# Patient Record
Sex: Male | Born: 1950 | Race: Black or African American | Hispanic: No | Marital: Married | Smoking: Former smoker
Health system: Southern US, Community
[De-identification: ages and names within clinical notes are randomized; demographics above are authoritative.]

## PROBLEM LIST (undated history)

## (undated) DIAGNOSIS — A048 Other specified bacterial intestinal infections: Secondary | ICD-10-CM

---

## 1997-05-23 ENCOUNTER — Emergency Department (HOSPITAL_COMMUNITY): Admission: EM | Admit: 1997-05-23 | Discharge: 1997-05-23 | Payer: Self-pay | Admitting: Emergency Medicine

## 1997-06-19 ENCOUNTER — Emergency Department (HOSPITAL_COMMUNITY): Admission: EM | Admit: 1997-06-19 | Discharge: 1997-06-19 | Payer: Self-pay | Admitting: Emergency Medicine

## 1997-10-09 ENCOUNTER — Emergency Department (HOSPITAL_COMMUNITY): Admission: EM | Admit: 1997-10-09 | Discharge: 1997-10-09 | Payer: Self-pay | Admitting: Emergency Medicine

## 1998-07-02 ENCOUNTER — Emergency Department (HOSPITAL_COMMUNITY): Admission: EM | Admit: 1998-07-02 | Discharge: 1998-07-02 | Payer: Self-pay | Admitting: Emergency Medicine

## 2000-03-11 ENCOUNTER — Emergency Department (HOSPITAL_COMMUNITY): Admission: EM | Admit: 2000-03-11 | Discharge: 2000-03-11 | Payer: Self-pay | Admitting: Emergency Medicine

## 2011-11-11 ENCOUNTER — Encounter (HOSPITAL_COMMUNITY): Payer: Self-pay | Admitting: Emergency Medicine

## 2011-11-11 ENCOUNTER — Emergency Department (HOSPITAL_COMMUNITY)
Admission: EM | Admit: 2011-11-11 | Discharge: 2011-11-11 | Disposition: A | Discharge status: Home / Self Care | Payer: PRIVATE HEALTH INSURANCE | Attending: Emergency Medicine | Admitting: Emergency Medicine

## 2011-11-11 DIAGNOSIS — M79609 Pain in unspecified limb: Secondary | ICD-10-CM | POA: Insufficient documentation

## 2011-11-11 DIAGNOSIS — R21 Rash and other nonspecific skin eruption: Secondary | ICD-10-CM | POA: Insufficient documentation

## 2011-11-11 MED ORDER — PREDNISONE 10 MG PO TABS: 20.0000 mg | ORAL_TABLET | Freq: Every day | ORAL | Status: DC

## 2011-11-11 MED ORDER — DOXYCYCLINE HYCLATE 100 MG PO CAPS: 100.0000 mg | ORAL_CAPSULE | Freq: Two times a day (BID) | ORAL | Status: DC

## 2011-11-11 NOTE — ED Notes (Signed)
Pt presented to ED with skin boils and rashes in his legs.He says it has been there for a month and has been tender sib=nce yesterday.

## 2011-11-11 NOTE — ED Notes (Signed)
Pt discharged.Vital signs stable and GCS 15 

## 2011-11-11 NOTE — ED Notes (Signed)
Pt reports having L leg pain for about 6 months, pt noted to have swelling to leg and many sores/abscesses to legs

## 2011-11-11 NOTE — ED Provider Notes (Signed)
History     CSN: [id number]  Arrival date & time 11/11/11  1650   First MD Initiated Contact with Patient 11/11/11 2150      Chief Complaint  Patient presents with  . Leg Pain    (Consider location/radiation/quality/duration/timing/severity/associated sxs/prior treatment) HPI  61 year old male with no significant past medical history presents complaining of rash and sores on his legs. Patient reports for the past 9 months he has had gradual onset of source which developed initially on his left lower leg and has been spreading throughout his extremities. Sores are described as a achy burning sensation with occasional pruritus. Onset is gradual, persistent, moderate in severity.  Denies fever, chills, abd pain, sores in mouth, hands or feet.  Denies similar rash in other family members.  Denies environmental changes.  Has not tried anything to alleviate his sxs.  Has never been evaluated for this before.  No hx of immunocompromise or hx of diabetes.    History reviewed. No pertinent past medical history.  History reviewed. No pertinent past surgical history.  No family history on file.  History  Substance Use Topics  . Smoking status: Never Smoker   . Smokeless tobacco: Not on file  . Alcohol Use: No      Review of Systems  Constitutional: Negative for fever.  HENT: Negative for sore throat, mouth sores and neck stiffness.   Respiratory: Negative for chest tightness and shortness of breath.   Cardiovascular: Negative for chest pain.  Gastrointestinal: Negative for abdominal pain.  Musculoskeletal: Negative for myalgias, joint swelling and arthralgias.  Skin: Positive for rash and wound.  Neurological: Negative for numbness.    Allergies  Review of patient's allergies indicates no known allergies.  Home Medications  No current outpatient prescriptions on file.  BP 140/97  Pulse 96  Temp 98.9 F (37.2 C) (Oral)  Resp 18  SpO2 97%  Physical Exam  Nursing note  and vitals reviewed. Constitutional: He is oriented to person, place, and time. He appears well-developed and well-nourished. No distress.  HENT:  Head: Atraumatic.  Mouth/Throat: Oropharynx is clear and moist. No oropharyngeal exudate.  Eyes: Conjunctivae normal and EOM are normal. Pupils are equal, round, and reactive to light.  Neck: Neck supple.  Cardiovascular: Normal rate and regular rhythm.   Pulmonary/Chest: Effort normal and breath sounds normal. No respiratory distress. He exhibits no tenderness.  Abdominal: Soft. He exhibits no distension. There is no tenderness.  Genitourinary: Penis normal.  Musculoskeletal: Normal range of motion. He exhibits no edema.  Neurological: He is alert and oriented to person, place, and time.  Skin: Skin is warm. Rash (pt has significant small maculopustular lesions throughout lower extremities, upper forarms, and on lower abdomen. Rash is scaly, some with surrounding erythema.  No obvious abscess.  Non petechiall) noted.       No oral mucosal involvement.  Rash spares palms of hands and soles of feet.      ED Course  Procedures (including critical care time)  Labs Reviewed - No data to display No results found.   No diagnosis found.  1. rash  MDM  Pt has impressive pustulovesicular rash throughout his lower extremities, forearms, and low abdomen.  He has no systemic involvement.  No mucosal involvement, is afebrile.  There are excoriation marks and scaly skin changes throughout.  I plan do prescribe doxycycline as treatment, prednisone to decrease itch, and referral resources for further care.  Pt has no abscess amenable to I&D.    BP  140/97  Pulse 96  Temp 98.9 F (37.2 C) (Oral)  Resp 18  SpO2 97%       Fayrene Helper, PA-C 11/11/11 2220

## 2011-11-12 NOTE — ED Provider Notes (Signed)
Medical screening examination/treatment/procedure(s) were performed by non-physician practitioner and as supervising physician I was immediately available for consultation/collaboration.   Lyanne Co, MD 11/12/11 [phone]

## 2012-05-03 ENCOUNTER — Emergency Department (HOSPITAL_COMMUNITY)
Admission: EM | Admit: 2012-05-03 | Discharge: 2012-05-03 | Disposition: A | Discharge status: Home / Self Care | Payer: PRIVATE HEALTH INSURANCE | Attending: Emergency Medicine | Admitting: Emergency Medicine

## 2012-05-03 ENCOUNTER — Emergency Department (HOSPITAL_COMMUNITY): Payer: PRIVATE HEALTH INSURANCE

## 2012-05-03 ENCOUNTER — Encounter (HOSPITAL_COMMUNITY): Payer: Self-pay | Admitting: Nurse Practitioner

## 2012-05-03 DIAGNOSIS — M25579 Pain in unspecified ankle and joints of unspecified foot: Secondary | ICD-10-CM | POA: Insufficient documentation

## 2012-05-03 DIAGNOSIS — B354 Tinea corporis: Secondary | ICD-10-CM | POA: Insufficient documentation

## 2012-05-03 DIAGNOSIS — R21 Rash and other nonspecific skin eruption: Secondary | ICD-10-CM | POA: Insufficient documentation

## 2012-05-03 DIAGNOSIS — M79671 Pain in right foot: Secondary | ICD-10-CM

## 2012-05-03 MED ORDER — TERBINAFINE HCL 125 MG PO PACK: 250.0000 mg | PACK | Freq: Every day | ORAL | Status: DC

## 2012-05-03 MED ORDER — IBUPROFEN 800 MG PO TABS: 800.0000 mg | ORAL_TABLET | Freq: Three times a day (TID) | ORAL | Status: DC

## 2012-05-03 NOTE — ED Provider Notes (Signed)
History    This chart was scribed for non-physician practitioner Elpidio Anis, PA-C working with Celene Kras, MD by Gerlean Ren, ED Scribe. This patient was seen in room TR09C/TR09C and the patient's care was started at 6:35 PM.   CSN: [id number]  Arrival date & time 05/03/12  1640   First MD Initiated Contact with Patient 05/03/12 1834      Chief Complaint  Patient presents with  . Leg Swelling  . Rash    The history is provided by the patient. No language interpreter was used.  Jesse Larsen is a 62 y.o. male who presents to the Emergency Department complaining of a rash under his left axillary region first noticed one month ago that began releasing odor recently.  Pt received ketoconazole cream and used it with no relief.   Pt also complains of pain and swelling to left ankle and left foot that began 3 weeks ago with no associated injury or trauma.  Pt reports he is on his feet all day at work, which worsens pain.   History reviewed. No pertinent past medical history.  History reviewed. No pertinent past surgical history.  History reviewed. No pertinent family history.  History  Substance Use Topics  . Smoking status: Never Smoker   . Smokeless tobacco: Not on file  . Alcohol Use: No      Review of Systems  Musculoskeletal:       Positive left foot pain  Skin: Positive for rash.  All other systems reviewed and are negative.    Allergies  Review of patient's allergies indicates no known allergies.  Home Medications  No current outpatient prescriptions on file.  BP 130/83  Pulse 82  Temp(Src) 98.2 F (36.8 C)  Resp 18  SpO2 99%  Physical Exam  Nursing note and vitals reviewed. Constitutional: He is oriented to person, place, and time. He appears well-developed and well-nourished. No distress.  HENT:  Head: Normocephalic and atraumatic.  Eyes: EOM are normal.  Neck: Neck supple. No tracheal deviation present.  Cardiovascular: Normal rate.    Pulmonary/Chest: Effort normal. No respiratory distress.  Musculoskeletal: Normal range of motion.  Neurological: He is alert and oriented to person, place, and time.  Normal strength in left foot.  Skin: Skin is warm and dry.  Thickened plaque left axilla multiple scaling circular scaling lesions to torso consistent with tinea. Left foot tender to the medial aspect of proximal arch, no bony deformity, no redness, no lesion.  Psychiatric: He has a normal mood and affect. His behavior is normal.    ED Course  Procedures (including critical care time) DIAGNOSTIC STUDIES: Oxygen Saturation is 99% on room air, normal by my interpretation.    COORDINATION OF CARE: 6:39 PM-Discussed anti-fungal PO medication and left foot XR.  Pt offered pain medicine but declines.  Pt verbalizes understanding.     No diagnosis found.  1. Tinea corporis 2. Right foot pain  MDM  Rash c/w tinea - uncomplicated. No bony abnormalities on non-traumatic foot pain - suspect mild musculoskeletal strain.      I personally performed the services described in this documentation, which was scribed in my presence. The recorded information has been reviewed and is accurate.     Arnoldo Hooker, PA-C 05/10/12 2309

## 2012-05-03 NOTE — ED Notes (Signed)
C/o painful red spots under armpits for past month. Saw doctor and was prescribed ketoconazole cream with no relief. Also c/o recent L ankle swelling after he has been standing all day. States pain on palpation to L foot

## 2012-05-11 NOTE — ED Provider Notes (Signed)
Medical screening examination/treatment/procedure(s) were performed by non-physician practitioner and as supervising physician I was immediately available for consultation/collaboration.    Nancyann Cotterman R Knute Mazzuca, MD 05/11/12 0752 

## 2014-04-03 ENCOUNTER — Encounter (HOSPITAL_COMMUNITY): Payer: Self-pay

## 2014-04-03 ENCOUNTER — Emergency Department (HOSPITAL_COMMUNITY)
Admission: EM | Admit: 2014-04-03 | Discharge: 2014-04-03 | Disposition: A | Discharge status: Home / Self Care | Payer: PRIVATE HEALTH INSURANCE | Attending: Emergency Medicine | Admitting: Emergency Medicine

## 2014-04-03 DIAGNOSIS — K625 Hemorrhage of anus and rectum: Secondary | ICD-10-CM

## 2014-04-03 DIAGNOSIS — Z8619 Personal history of other infectious and parasitic diseases: Secondary | ICD-10-CM | POA: Diagnosis not present

## 2014-04-03 DIAGNOSIS — Z79899 Other long term (current) drug therapy: Secondary | ICD-10-CM | POA: Insufficient documentation

## 2014-04-03 HISTORY — DX: Other specified bacterial intestinal infections: A04.8

## 2014-04-03 LAB — CBC
HCT: 37.6 % — ABNORMAL LOW (ref 39.0–52.0)
Hemoglobin: 12.5 g/dL — ABNORMAL LOW (ref 13.0–17.0)
MCH: 30.6 pg (ref 26.0–34.0)
MCHC: 33.2 g/dL (ref 30.0–36.0)
MCV: 91.9 fL (ref 78.0–100.0)
PLATELETS: 273 10*3/uL (ref 150–400)
RBC: 4.09 MIL/uL — AB (ref 4.22–5.81)
RDW: 12.7 % (ref 11.5–15.5)
WBC: 4.6 10*3/uL (ref 4.0–10.5)

## 2014-04-03 LAB — COMPREHENSIVE METABOLIC PANEL
ALBUMIN: 3.7 g/dL (ref 3.5–5.2)
ALT: 31 U/L (ref 0–53)
AST: 23 U/L (ref 0–37)
Alkaline Phosphatase: 42 U/L (ref 39–117)
Anion gap: 6 (ref 5–15)
BUN: 12 mg/dL (ref 6–23)
CALCIUM: 9.3 mg/dL (ref 8.4–10.5)
CO2: 29 mmol/L (ref 19–32)
CREATININE: 1 mg/dL (ref 0.50–1.35)
Chloride: 105 mmol/L (ref 96–112)
GFR calc Af Amer: 90 mL/min — ABNORMAL LOW (ref >90)
GFR calc non Af Amer: 78 mL/min — ABNORMAL LOW (ref >90)
Glucose, Bld: 124 mg/dL — ABNORMAL HIGH (ref 70–99)
Potassium: 3.7 mmol/L (ref 3.5–5.1)
Sodium: 140 mmol/L (ref 135–145)
TOTAL PROTEIN: 7.2 g/dL (ref 6.0–8.3)
Total Bilirubin: 0.3 mg/dL (ref 0.3–1.2)

## 2014-04-03 MED ORDER — DOCUSATE SODIUM 100 MG PO CAPS: 100.0000 mg | ORAL_CAPSULE | Freq: Two times a day (BID) | ORAL | Status: DC

## 2014-04-03 NOTE — ED Notes (Signed)
Dr. Patria Maneampos at bedside with patient and family.

## 2014-04-03 NOTE — ED Notes (Signed)
Pt noticed some bright red blood on the tissue paper when he wiped this morning and wants to be checked out. No pain. No hx of hemorrhoids or bleeding before.

## 2014-04-05 NOTE — ED Provider Notes (Signed)
CSN: [id number]     Arrival date & time 04/03/14  1153 History   First MD Initiated Contact with Patient 04/03/14 1305     Chief Complaint  Patient presents with  . Rectal Bleeding      HPI Patient presents to the emergency department complaining of noted bright red blood on the tissue and having a bowel movement today.  He intermittently struggles with constipation reports she's had harder stools lately.  He states there is a small amount of blood on the outside of his stool over the past several days.  Today he noticed the blood with wiping and a small amount of blood in the commode.  He denies passing clots or frank hematochezia.  Denies use of anticoagulants.  No abdominal pain.  Denies weakness or lightheadedness.  No other complaints.  Symptoms are mild in severity.   Past Medical History  Diagnosis Date  . Helicobacter pylori (H. pylori) infection    History reviewed. No pertinent past surgical history. No family history on file. History  Substance Use Topics  . Smoking status: Never Smoker   . Smokeless tobacco: Not on file  . Alcohol Use: No    Review of Systems  All other systems reviewed and are negative.     Allergies  Review of patient's allergies indicates no known allergies.  Home Medications   Prior to Admission medications   Medication Sig Start Date End Date Taking? Authorizing Provider  Ascorbic Acid (VITAMIN C PO) Take 1 tablet by mouth daily.   Yes Historical Provider, MD  b complex vitamins tablet Take 1 tablet by mouth daily.   Yes Historical Provider, MD  ibuprofen (ADVIL,MOTRIN) 200 MG tablet Take 200 mg by mouth every 6 (six) hours as needed for mild pain or moderate pain.   Yes Historical Provider, MD  Zinc 100 MG TABS Take 1 tablet by mouth daily.   Yes Historical Provider, MD  docusate sodium (COLACE) 100 MG capsule Take 1 capsule (100 mg total) by mouth every 12 (twelve) hours. 04/03/14   Azalia BilisKevin Eriq Hufford, MD  ibuprofen (ADVIL,MOTRIN) 800 MG tablet  Take 1 tablet (800 mg total) by mouth 3 (three) times daily. Patient not taking: Reported on 04/03/2014 05/03/12   Elpidio AnisShari Upstill, PA-C  Terbinafine HCl 125 MG PACK Take 250 mg by mouth daily. Patient not taking: Reported on 04/03/2014 05/03/12   Elpidio AnisShari Upstill, PA-C   BP 145/93 mmHg  Pulse 71  Temp(Src) 98.2 F (36.8 C)  Resp 14  Ht 6\' 3"  (1.905 m)  Wt 185 lb (83.915 kg)  BMI 23.12 kg/m2  SpO2 100% Physical Exam  Constitutional: He is oriented to person, place, and time. He appears well-developed and well-nourished.  HENT:  Head: Normocephalic and atraumatic.  Eyes: EOM are normal.  Neck: Normal range of motion.  Cardiovascular: Normal rate, regular rhythm, normal heart sounds and intact distal pulses.   Pulmonary/Chest: Effort normal and breath sounds normal. No respiratory distress.  Abdominal: Soft. He exhibits no distension. There is no tenderness.  Genitourinary:  Small nonbleeding external hemorrhoid.  Small nonbleeding fissure at 6:00.  No palpable rectal masses.  No gross blood.  Brown stool.  Musculoskeletal: Normal range of motion.  Neurological: He is alert and oriented to person, place, and time.  Skin: Skin is warm and dry.  Psychiatric: He has a normal mood and affect. Judgment normal.  Nursing note and vitals reviewed.   ED Course  Procedures (including critical care time) Labs Review Labs Reviewed  CBC -  Abnormal; Notable for the following:    RBC 4.09 (*)    Hemoglobin 12.5 (*)    HCT 37.6 (*)    All other components within normal limits  COMPREHENSIVE METABOLIC PANEL - Abnormal; Notable for the following:    Glucose, Bld 124 (*)    GFR calc non Af Amer 78 (*)    GFR calc Af Amer 90 (*)    All other components within normal limits    Imaging Review No results found.   EKG Interpretation None      MDM   Final diagnoses:  Rectal bleeding    Suspect small bleeding internal hemorrhoid.  Vital signs are normal.  Outpatient GI follow-up.  He may  benefit from endoscopy or sigmoidoscopy in the office.  He has also never had a colonoscopy and therefore given his age of 64 year old likely benefit from this from a screening standpoint as well.  Patient and family understand this.  He understands to return the ER for any new or worsening symptoms    Azalia Bilis, MD 04/05/14 [phone]

## 2014-10-03 ENCOUNTER — Encounter (HOSPITAL_COMMUNITY): Payer: Self-pay | Admitting: *Deleted

## 2014-10-03 ENCOUNTER — Emergency Department (HOSPITAL_COMMUNITY)
Admission: EM | Admit: 2014-10-03 | Discharge: 2014-10-03 | Disposition: A | Discharge status: Home / Self Care | Payer: PRIVATE HEALTH INSURANCE | Attending: Emergency Medicine | Admitting: Emergency Medicine

## 2014-10-03 DIAGNOSIS — Y9289 Other specified places as the place of occurrence of the external cause: Secondary | ICD-10-CM | POA: Insufficient documentation

## 2014-10-03 DIAGNOSIS — Y998 Other external cause status: Secondary | ICD-10-CM | POA: Insufficient documentation

## 2014-10-03 DIAGNOSIS — Y93G3 Activity, cooking and baking: Secondary | ICD-10-CM | POA: Diagnosis not present

## 2014-10-03 DIAGNOSIS — T300 Burn of unspecified body region, unspecified degree: Secondary | ICD-10-CM

## 2014-10-03 DIAGNOSIS — Z79899 Other long term (current) drug therapy: Secondary | ICD-10-CM | POA: Diagnosis not present

## 2014-10-03 DIAGNOSIS — Y272XXA Contact with hot fluids, undetermined intent, initial encounter: Secondary | ICD-10-CM | POA: Diagnosis not present

## 2014-10-03 DIAGNOSIS — Z8619 Personal history of other infectious and parasitic diseases: Secondary | ICD-10-CM | POA: Insufficient documentation

## 2014-10-03 DIAGNOSIS — T25221A Burn of second degree of right foot, initial encounter: Secondary | ICD-10-CM | POA: Insufficient documentation

## 2014-10-03 MED ORDER — SILVER SULFADIAZINE 1 % EX CREA
TOPICAL_CREAM | Freq: Two times a day (BID) | CUTANEOUS | Status: DC
  Administered 2014-10-03: 10:00:00 via TOPICAL
  Filled 2014-10-03: qty 85

## 2014-10-03 NOTE — Discharge Instructions (Signed)
Burn Care Your skin is a natural barrier to infection. It is the largest organ of your body. Burns damage this natural protection. To help prevent infection, it is very important to follow your caregiver's instructions in the care of your burn. Burns are classified as:  First degree. There is only redness of the skin (erythema). No scarring is expected.  Second degree. There is blistering of the skin. Scarring may occur with deeper burns.  Third degree. All layers of the skin are injured, and scarring is expected. HOME CARE INSTRUCTIONS   Wash your hands well before changing your bandage.  Change your bandage as often as directed by your caregiver.  Remove the old bandage. If the bandage sticks, you may soak it off with cool, clean water.  Cleanse the burn thoroughly but gently with mild soap and water.  Pat the area dry with a clean, dry cloth.  Apply a thin layer of antibacterial cream to the burn.  Apply a clean bandage as instructed by your caregiver.  Keep the bandage as clean and dry as possible.  Elevate the affected area for the first 24 hours, then as instructed by your caregiver.  Only take over-the-counter or prescription medicines for pain, discomfort, or fever as directed by your caregiver. SEEK IMMEDIATE MEDICAL CARE IF:   You develop excessive pain.  You develop redness, tenderness, swelling, or red streaks near the burn.  The burned area develops yellowish-white fluid (pus) or a bad smell.  You have a fever. MAKE SURE YOU:   Understand these instructions.  Will watch your condition.  Will get help right away if you are not doing well or get worse. Document Released: 12/29/2004 Document Revised: 03/23/2011 Document Reviewed: 05/21/2010 [facility] Patient Information 2015 [place], [place]. This information is not intended to replace advice given to you by your health care provider. Make sure you discuss any questions you have with your health care  provider.  Please follow-up with [facility] for further evaluation and management. Please return immediately if signs of infection present

## 2014-10-03 NOTE — ED Notes (Signed)
Declined W/C at D/C and was escorted to lobby by RN. 
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
## 2015-04-10 ENCOUNTER — Encounter (HOSPITAL_COMMUNITY): Payer: Self-pay | Admitting: Emergency Medicine

## 2015-04-10 DIAGNOSIS — R21 Rash and other nonspecific skin eruption: Secondary | ICD-10-CM | POA: Insufficient documentation

## 2015-04-10 DIAGNOSIS — Z8619 Personal history of other infectious and parasitic diseases: Secondary | ICD-10-CM | POA: Diagnosis not present

## 2015-04-10 DIAGNOSIS — L989 Disorder of the skin and subcutaneous tissue, unspecified: Secondary | ICD-10-CM | POA: Diagnosis not present

## 2015-04-10 DIAGNOSIS — R2242 Localized swelling, mass and lump, left lower limb: Secondary | ICD-10-CM | POA: Diagnosis not present

## 2015-04-10 DIAGNOSIS — Z79899 Other long term (current) drug therapy: Secondary | ICD-10-CM | POA: Insufficient documentation

## 2015-04-10 DIAGNOSIS — M79662 Pain in left lower leg: Secondary | ICD-10-CM | POA: Diagnosis not present

## 2015-04-10 LAB — CBC WITH DIFFERENTIAL/PLATELET
Basophils Absolute: 0 10*3/uL (ref 0.0–0.1)
Basophils Relative: 0 %
EOS PCT: 3 %
Eosinophils Absolute: 0.2 10*3/uL (ref 0.0–0.7)
HEMATOCRIT: 34.4 % — AB (ref 39.0–52.0)
Hemoglobin: 11.6 g/dL — ABNORMAL LOW (ref 13.0–17.0)
LYMPHS ABS: 2.4 10*3/uL (ref 0.7–4.0)
LYMPHS PCT: 39 %
MCH: 30.9 pg (ref 26.0–34.0)
MCHC: 33.7 g/dL (ref 30.0–36.0)
MCV: 91.7 fL (ref 78.0–100.0)
MONO ABS: 0.7 10*3/uL (ref 0.1–1.0)
Monocytes Relative: 12 %
NEUTROS ABS: 2.9 10*3/uL (ref 1.7–7.7)
Neutrophils Relative %: 46 %
PLATELETS: 270 10*3/uL (ref 150–400)
RBC: 3.75 MIL/uL — AB (ref 4.22–5.81)
RDW: 13.1 % (ref 11.5–15.5)
WBC: 6.1 10*3/uL (ref 4.0–10.5)

## 2015-04-10 LAB — COMPREHENSIVE METABOLIC PANEL
ALT: 25 U/L (ref 17–63)
AST: 19 U/L (ref 15–41)
Albumin: 3.3 g/dL — ABNORMAL LOW (ref 3.5–5.0)
Alkaline Phosphatase: 35 U/L — ABNORMAL LOW (ref 38–126)
Anion gap: 7 (ref 5–15)
BILIRUBIN TOTAL: 0.4 mg/dL (ref 0.3–1.2)
BUN: 16 mg/dL (ref 6–20)
CALCIUM: 9 mg/dL (ref 8.9–10.3)
CHLORIDE: 109 mmol/L (ref 101–111)
CO2: 25 mmol/L (ref 22–32)
CREATININE: 1.05 mg/dL (ref 0.61–1.24)
Glucose, Bld: 126 mg/dL — ABNORMAL HIGH (ref 65–99)
Potassium: 3.3 mmol/L — ABNORMAL LOW (ref 3.5–5.1)
Sodium: 141 mmol/L (ref 135–145)
Total Protein: 6.2 g/dL — ABNORMAL LOW (ref 6.5–8.1)

## 2015-04-10 NOTE — ED Notes (Signed)
Pt. reports abscess at left lower leg with drainage and swelling at left foot / lower leg onset 2 days ago , denies injury , no fever or chills.

## 2015-04-11 ENCOUNTER — Emergency Department (HOSPITAL_COMMUNITY)
Admission: EM | Admit: 2015-04-11 | Discharge: 2015-04-11 | Disposition: A | Discharge status: Home / Self Care | Payer: PRIVATE HEALTH INSURANCE | Attending: Emergency Medicine | Admitting: Emergency Medicine

## 2015-04-11 ENCOUNTER — Ambulatory Visit (HOSPITAL_BASED_OUTPATIENT_CLINIC_OR_DEPARTMENT_OTHER)
Admission: RE | Admit: 2015-04-11 | Discharge: 2015-04-11 | Disposition: A | Discharge status: Home / Self Care | Payer: PRIVATE HEALTH INSURANCE | Source: Ambulatory Visit | Attending: Emergency Medicine | Admitting: Emergency Medicine

## 2015-04-11 DIAGNOSIS — M79605 Pain in left leg: Secondary | ICD-10-CM | POA: Insufficient documentation

## 2015-04-11 DIAGNOSIS — M79609 Pain in unspecified limb: Secondary | ICD-10-CM

## 2015-04-11 DIAGNOSIS — M7989 Other specified soft tissue disorders: Secondary | ICD-10-CM

## 2015-04-11 DIAGNOSIS — R21 Rash and other nonspecific skin eruption: Secondary | ICD-10-CM

## 2015-04-11 MED ORDER — HYDROCODONE-ACETAMINOPHEN 5-325 MG PO TABS: 1.0000 | ORAL_TABLET | ORAL | Status: DC | PRN

## 2015-04-11 MED ORDER — ENOXAPARIN SODIUM 100 MG/ML ~~LOC~~ SOLN
1.0000 mg/kg | Freq: Once | SUBCUTANEOUS | Status: AC
  Administered 2015-04-11: 85 mg via SUBCUTANEOUS
  Filled 2015-04-11: qty 1

## 2015-04-11 MED ORDER — SULFAMETHOXAZOLE-TRIMETHOPRIM 800-160 MG PO TABS: 1.0000 | ORAL_TABLET | Freq: Two times a day (BID) | ORAL | Status: AC

## 2015-04-11 MED ORDER — SULFAMETHOXAZOLE-TRIMETHOPRIM 800-160 MG PO TABS
1.0000 | ORAL_TABLET | Freq: Once | ORAL | Status: AC
  Administered 2015-04-11: 1 via ORAL
  Filled 2015-04-11: qty 1

## 2015-04-11 NOTE — Progress Notes (Signed)
*  Preliminary Results* Left lower extremity venous duplex completed. Left lower extremity is negative for deep vein thrombosis. There is no evidence of left Baker's cyst.  04/11/2015 9:45 AM  Gertie FeyMichelle Clancey Welton, RVT, RDCS, RDMS

## 2015-04-11 NOTE — Discharge Instructions (Signed)
Please return later today for an ultrasound of your leg to rule out a blood clot (DVT).   Edema Edema is an abnormal buildup of fluids in your bodytissues. Edema is somewhatdependent on gravity to pull the fluid to the lowest place in your body. That makes the condition more common in the legs and thighs (lower extremities). Painless swelling of the feet and ankles is common and becomes more likely as you get older. It is also common in looser tissues, like around your eyes.  When the affected area is squeezed, the fluid may move out of that spot and leave a dent for a few moments. This dent is called pitting.  CAUSES  There are many possible causes of edema. Eating too much salt and being on your feet or sitting for a long time can cause edema in your legs and ankles. Hot weather may make edema worse. Common medical causes of edema include:  Heart failure.  Liver disease.  Kidney disease.  Weak blood vessels in your legs.  Cancer.  An injury.  Pregnancy.  Some medications.  Obesity. SYMPTOMS  Edema is usually painless.Your skin may look swollen or shiny.  DIAGNOSIS  Your health care provider may be able to diagnose edema by asking about your medical history and doing a physical exam. You may need to have tests such as X-rays, an electrocardiogram, or blood tests to check for medical conditions that may cause edema.  TREATMENT  Edema treatment depends on the cause. If you have heart, liver, or kidney disease, you need the treatment appropriate for these conditions. General treatment may include:  Elevation of the affected body part above the level of your heart.  Compression of the affected body part. Pressure from elastic bandages or support stockings squeezes the tissues and forces fluid back into the blood vessels. This keeps fluid from entering the tissues.  Restriction of fluid and salt intake.  Use of a water pill (diuretic). These medications are appropriate only for  some types of edema. They pull fluid out of your body and make you urinate more often. This gets rid of fluid and reduces swelling, but diuretics can have side effects. Only use diuretics as directed by your health care provider. HOME CARE INSTRUCTIONS   Keep the affected body part above the level of your heart when you are lying down.   Do not sit still or stand for prolonged periods.   Do not put anything directly under your knees when lying down.  Do not wear constricting clothing or garters on your upper legs.   Exercise your legs to work the fluid back into your blood vessels. This may help the swelling go down.   Wear elastic bandages or support stockings to reduce ankle swelling as directed by your health care provider.   Eat a low-salt diet to reduce fluid if your health care provider recommends it.   Only take medicines as directed by your health care provider. SEEK MEDICAL CARE IF:   Your edema is not responding to treatment.  You have heart, liver, or kidney disease and notice symptoms of edema.  You have edema in your legs that does not improve after elevating them.   You have sudden and unexplained weight gain. SEEK IMMEDIATE MEDICAL CARE IF:   You develop shortness of breath or chest pain.   You cannot breathe when you lie down.  You develop pain, redness, or warmth in the swollen areas.   You have heart, liver, or kidney  disease and suddenly get edema.  You have a fever and your symptoms suddenly get worse. MAKE SURE YOU:   Understand these instructions.  Will watch your condition.  Will get help right away if you are not doing well or get worse.   This information is not intended to replace advice given to you by your health care provider. Make sure you discuss any questions you have with your health care provider.   Document Released: 12/29/2004 Document Revised: 01/19/2014 Document Reviewed: 10/21/2012 Elsevier Interactive Patient Education  2016 Elsevier Inc.   Mild Early Cellulitis Cellulitis is an infection of the skin and the tissue beneath it. The infected area is usually red and tender. Cellulitis occurs most often in the arms and lower legs.  CAUSES  Cellulitis is caused by bacteria that enter the skin through cracks or cuts in the skin. The most common types of bacteria that cause cellulitis are staphylococci and streptococci. SIGNS AND SYMPTOMS   Redness and warmth.  Swelling.  Tenderness or pain.  Fever. DIAGNOSIS  Your health care provider can usually determine what is wrong based on a physical exam. Blood tests may also be done. TREATMENT  Treatment usually involves taking an antibiotic medicine. HOME CARE INSTRUCTIONS   Take your antibiotic medicine as directed by your health care provider. Finish the antibiotic even if you start to feel better.  Keep the infected arm or leg elevated to reduce swelling.  Apply a warm cloth to the affected area up to 4 times per day to relieve pain.  Take medicines only as directed by your health care provider.  Keep all follow-up visits as directed by your health care provider. SEEK MEDICAL CARE IF:   You notice red streaks coming from the infected area.  Your red area gets larger or turns dark in color.  Your bone or joint underneath the infected area becomes painful after the skin has healed.  Your infection returns in the same area or another area.  You notice a swollen bump in the infected area.  You develop new symptoms.  You have a fever. SEEK IMMEDIATE MEDICAL CARE IF:   You feel very sleepy.  You develop vomiting or diarrhea.  You have a general ill feeling (malaise) with muscle aches and pains.   This information is not intended to replace advice given to you by your health care provider. Make sure you discuss any questions you have with your health care provider.   Document Released: 10/08/2004 Document Revised: 09/19/2014 Document Reviewed:  03/16/2011 Elsevier Interactive Patient Education 2016 [employer]    [facility]  3.2 (9)  Dermatologist  [address]  [phone]  Opens at 8:00 AM  Website  Directions  Dr. Mertha FindersJohn H. Hall Jr, MD  2.6 (18)  Dermatologist  [address]  [phone]  Website  Directions  [facility]  3.5 (3)  [facility]  [address]  [phone]  Opens at 8:00 AM  Website  Directions  [facility]  4.0 (4)  Dermatologist  [address]  [phone]  Website  Directions  Janalyn Harderafeen Stuart MD  3.0 (2)  Dermatologist  [address]  [phone]  Website  Directions  Hoyle SauerMccoy Bruce  2.7 (6)  Dermatologist  [address]  [phone]  Directions  [place] Amy Y MD  2.0 (1)  Dermatologist  [address]  [phone]  Website  Directions  [facility]  5.0 (3)  Doctor  [address]  [phone]  Opens at 8:30 AM  Website  Directions

## 2015-04-11 NOTE — ED Provider Notes (Signed)
By signing my name below, I, Jesse BusmanDiana Larsen, attest that this documentation has been prepared under the direction and in the presence of Jesse N Ward, DO . Electronically Signed: Freida Busmaniana Larsen, Scribe. 04/11/2015. 1:13 AM.  TIME SEEN: 1:13 AM  CHIEF COMPLAINT:  Chief Complaint  Patient presents with  . Abscess  . Leg Swelling    HPI:   HPI Comments:  Jesse Larsen is a 65 y.o. male who presents to the Emergency Department complaining of an area of swelling to his left lower leg x 2 days with associated mild pain to the site and mild drainage. He denies fever, chills, CP, SOB, nausea, vomiting, diarrhea, injury to the extremity, and h/o LE blood clots. No alleviating factors noted.Not a diabetic or immunocompromised. States he has had this rash to his leg for several years but just noticed over the past 2 days that he has had some purulent drainage from one of the areas in the medial left calf. It is causing some swelling in his ankle. No injury to the leg.   ROS: See HPI Constitutional: no fever  Eyes: no drainage  ENT: no runny nose   Cardiovascular:  no chest pain  Resp: no SOB  GI: no vomiting GU: no dysuria Integumentary: no rash  Allergy: no hives  Musculoskeletal: leg swelling  Neurological: no slurred speech ROS otherwise negative  PAST MEDICAL HISTORY/PAST SURGICAL HISTORY:  Past Medical History  Diagnosis Date  . Helicobacter pylori (H. pylori) infection     MEDICATIONS:  Prior to Admission medications   Medication Sig Start Date End Date Taking? Authorizing Provider  Ascorbic Acid (VITAMIN C PO) Take 1 tablet by mouth daily.   Yes Historical Provider, MD  b complex vitamins tablet Take 1 tablet by mouth daily.   Yes Historical Provider, MD  docusate sodium (COLACE) 100 MG capsule Take 1 capsule (100 mg total) by mouth every 12 (twelve) hours. 04/03/14  Yes Azalia BilisKevin Campos, MD  ibuprofen (ADVIL,MOTRIN) 200 MG tablet Take 200 mg by mouth every 6 (six) hours as  needed for mild pain or moderate pain.   Yes Historical Provider, MD  Zinc 100 MG TABS Take 1 tablet by mouth daily.   Yes Historical Provider, MD    ALLERGIES:  No Known Allergies  SOCIAL HISTORY:  Social History  Substance Use Topics  . Smoking status: Never Smoker   . Smokeless tobacco: Not on file  . Alcohol Use: No    FAMILY HISTORY: No family history on file.  EXAM: BP 160/93 mmHg  Pulse 71  Temp(Src) 97.7 F (36.5 C) (Oral)  Resp 18  Ht 6\' 1"  (1.854 m)  Wt 191 lb 7 oz (86.835 kg)  BMI 25.26 kg/m2  SpO2 100% CONSTITUTIONAL: Alert and oriented and responds appropriately to questions. Well-appearing; well-nourished HEAD: Normocephalic EYES: Conjunctivae clear, PERRL ENT: normal nose; no rhinorrhea; moist mucous membranes NECK: Supple, no meningismus, no LAD  CARD: RRR; S1 and S2 appreciated; no murmurs, no clicks, no rubs, no gallops RESP: Normal chest excursion without splinting or tachypnea; breath sounds clear and equal bilaterally; no wheezes, no rhonchi, no rales, no hypoxia or respiratory distress, speaking full sentences ABD/GI: Normal bowel sounds; non-distended; soft, non-tender, no rebound, no guarding, no peritoneal signs BACK:  The back appears normal and is non-tender to palpation, there is no CVA tenderness EXT: Normal ROM in all joints; non-tender to palpation; he does have some dependent nonpitting edema noted in the left foot and left ankle. There is no  joint effusion. No erythema or warmth. No bony deformity; normal capillary refill; no cyanosis, no calf tenderness or swelling    SKIN: Normal color for age and race; warm; patient has eczematous, slightly raised excoriated lesions throughout his lower extremities, there are 2 areas to the medial left calf there are slightly raised without drainage or significant surrounding erythema or warmth. There is no fluctuance or induration. NEURO: Moves all extremities equally, sensation to light touch intact  diffusely, cranial nerves II through XII intact PSYCH: The patient's mood and manner are appropriate. Grooming and personal hygiene are appropriate.  MEDICAL DECISION MAKING: Patient here with a rash to his lower extremities that he has had for several years. Advised him to follow up with his PCP and likely outpatient dermatology for biopsy. Lesions appear eczematous in nature. He does have 2 areas that he has had some small amount of present drainage. There is no fluctuance this is just a large abscess that needs drainage today. We'll place him on Bactrim for possible infection. He does not have a significant cellulitis. He is afebrile and nontoxic appearing. His labs are unremarkable. He does have some swelling in the left leg compared to the right. Discussed with patient that DVT is also on the differential. We'll give him a dose of therapeutic Lovenox and have him return later today for a venous Doppler. He is neurovascular intact distally. No chest pain or shortness of breath. No fever. No vomiting. I feel he is safe to be discharged home.   At this time, I do not feel there is any life-threatening condition present. I have reviewed and discussed all results (EKG, imaging, lab, urine as appropriate), exam findings with patient. I have reviewed nursing notes and appropriate previous records.  I feel the patient is safe to be discharged home without further emergent workup. Discussed usual and customary return precautions. Patient and family (if present) verbalize understanding and are comfortable with this plan.  Patient will follow-up with their primary care provider. If they do not have a primary care provider, information for follow-up has been provided to them. All questions have been answered.  I personally performed the services described in this documentation, which was scribed in my presence. The recorded information has been reviewed and is accurate.    Layla Maw Ward, DO 04/11/15 [phone]

## 2016-12-13 ENCOUNTER — Emergency Department (HOSPITAL_COMMUNITY): Payer: PRIVATE HEALTH INSURANCE

## 2016-12-13 ENCOUNTER — Other Ambulatory Visit: Payer: Self-pay

## 2016-12-13 ENCOUNTER — Encounter (HOSPITAL_COMMUNITY): Payer: Self-pay

## 2016-12-13 ENCOUNTER — Emergency Department (HOSPITAL_COMMUNITY)
Admission: EM | Admit: 2016-12-13 | Discharge: 2016-12-13 | Disposition: A | Discharge status: Home / Self Care | Payer: PRIVATE HEALTH INSURANCE | Attending: Emergency Medicine | Admitting: Emergency Medicine

## 2016-12-13 DIAGNOSIS — N23 Unspecified renal colic: Secondary | ICD-10-CM | POA: Diagnosis not present

## 2016-12-13 DIAGNOSIS — R109 Unspecified abdominal pain: Secondary | ICD-10-CM | POA: Diagnosis present

## 2016-12-13 DIAGNOSIS — Z87891 Personal history of nicotine dependence: Secondary | ICD-10-CM | POA: Insufficient documentation

## 2016-12-13 DIAGNOSIS — N132 Hydronephrosis with renal and ureteral calculous obstruction: Secondary | ICD-10-CM | POA: Diagnosis not present

## 2016-12-13 DIAGNOSIS — N201 Calculus of ureter: Secondary | ICD-10-CM

## 2016-12-13 LAB — CBC WITH DIFFERENTIAL/PLATELET
BASOS PCT: 0 %
Basophils Absolute: 0 10*3/uL (ref 0.0–0.1)
EOS ABS: 0 10*3/uL (ref 0.0–0.7)
EOS PCT: 0 %
HCT: 37.5 % — ABNORMAL LOW (ref 39.0–52.0)
HEMOGLOBIN: 12.7 g/dL — AB (ref 13.0–17.0)
Lymphocytes Relative: 14 %
Lymphs Abs: 1.4 10*3/uL (ref 0.7–4.0)
MCH: 31.8 pg (ref 26.0–34.0)
MCHC: 33.9 g/dL (ref 30.0–36.0)
MCV: 94 fL (ref 78.0–100.0)
MONO ABS: 1 10*3/uL (ref 0.1–1.0)
MONOS PCT: 10 %
NEUTROS PCT: 76 %
Neutro Abs: 7.2 10*3/uL (ref 1.7–7.7)
PLATELETS: 276 10*3/uL (ref 150–400)
RBC: 3.99 MIL/uL — ABNORMAL LOW (ref 4.22–5.81)
RDW: 12.8 % (ref 11.5–15.5)
WBC: 9.5 10*3/uL (ref 4.0–10.5)

## 2016-12-13 LAB — URINALYSIS, ROUTINE W REFLEX MICROSCOPIC
BACTERIA UA: NONE SEEN
Bilirubin Urine: NEGATIVE
Glucose, UA: 50 mg/dL — AB
KETONES UR: NEGATIVE mg/dL
Leukocytes, UA: NEGATIVE
NITRITE: NEGATIVE
PROTEIN: NEGATIVE mg/dL
Specific Gravity, Urine: 1.011 (ref 1.005–1.030)
Squamous Epithelial / LPF: NONE SEEN
pH: 7 (ref 5.0–8.0)

## 2016-12-13 LAB — COMPREHENSIVE METABOLIC PANEL
ALT: 30 U/L (ref 17–63)
AST: 29 U/L (ref 15–41)
Albumin: 3.7 g/dL (ref 3.5–5.0)
Alkaline Phosphatase: 48 U/L (ref 38–126)
Anion gap: 9 (ref 5–15)
BUN: 14 mg/dL (ref 6–20)
CHLORIDE: 104 mmol/L (ref 101–111)
CO2: 26 mmol/L (ref 22–32)
CREATININE: 1.28 mg/dL — AB (ref 0.61–1.24)
Calcium: 9.1 mg/dL (ref 8.9–10.3)
GFR calc Af Amer: >60 mL/min (ref >60)
GFR calc non Af Amer: 57 mL/min — ABNORMAL LOW (ref >60)
Glucose, Bld: 175 mg/dL — ABNORMAL HIGH (ref 65–99)
Potassium: 3.4 mmol/L — ABNORMAL LOW (ref 3.5–5.1)
SODIUM: 139 mmol/L (ref 135–145)
Total Bilirubin: 0.7 mg/dL (ref 0.3–1.2)
Total Protein: 7.2 g/dL (ref 6.5–8.1)

## 2016-12-13 LAB — I-STAT CG4 LACTIC ACID, ED
LACTIC ACID, VENOUS: 2.87 mmol/L — AB (ref 0.5–1.9)
LACTIC ACID, VENOUS: 2.03 mmol/L — AB (ref 0.5–1.9)

## 2016-12-13 LAB — LIPASE, BLOOD: LIPASE: 23 U/L (ref 11–51)

## 2016-12-13 MED ORDER — KETOROLAC TROMETHAMINE 30 MG/ML IJ SOLN
30.0000 mg | Freq: Once | INTRAMUSCULAR | Status: AC
  Administered 2016-12-13: 30 mg via INTRAVENOUS
  Filled 2016-12-13: qty 1

## 2016-12-13 MED ORDER — SODIUM CHLORIDE 0.9 % IV BOLUS (SEPSIS)
500.0000 mL | Freq: Once | INTRAVENOUS | Status: AC
  Administered 2016-12-13: 500 mL via INTRAVENOUS

## 2016-12-13 MED ORDER — OXYCODONE-ACETAMINOPHEN 5-325 MG PO TABS: 2.0000 | ORAL_TABLET | ORAL | 0 refills | Status: AC | PRN

## 2016-12-13 MED ORDER — FENTANYL CITRATE (PF) 100 MCG/2ML IJ SOLN
50.0000 ug | Freq: Once | INTRAMUSCULAR | Status: AC
  Administered 2016-12-13: 50 ug via INTRAVENOUS
  Filled 2016-12-13: qty 2

## 2016-12-13 MED ORDER — ONDANSETRON 8 MG PO TBDP: 8.0000 mg | ORAL_TABLET | Freq: Three times a day (TID) | ORAL | 0 refills | Status: DC | PRN

## 2016-12-13 NOTE — ED Notes (Signed)
Advised Dr. Rosalia Hammersay pt requesting pain medication

## 2016-12-13 NOTE — ED Notes (Signed)
Pt aware urine is needed.  

## 2016-12-13 NOTE — ED Provider Notes (Signed)
  [facility] Note   CSN: [id number] Arrival date & time: 12/13/16  0908     History   Chief Complaint Chief Complaint  Patient presents with  . Abdominal Pain    HPI Jesse Larsen is a 66 y.o. male.  HPI 66 year old man complaining of left flank pain radiating from back to groin that began approximately 6 AM this morning awakening him from sleep.  He describes the pain as constant and severe.  He has had some nausea but no vomiting.  He denies any urinary tract symptoms such as frequency or hematuria.  He has not had any vomiting, diarrhea, or fever.  He has had no similar symptoms in the past.  Past medical history. History reviewed. No pertinent past medical history.  There are no active problems to display for this patient.   History reviewed. No pertinent surgical history.     Home Medications    Prior to Admission medications   Not on File    Family History No family history on file.  Social History Social History   Tobacco Use  . Smoking status: Former Games developermoker  . Smokeless tobacco: Never Used  Substance Use Topics  . Alcohol use: Not on file  . Drug use: Not on file     Allergies   Patient has no known allergies.   Review of Systems Review of Systems  Constitutional: Negative.   HENT: Negative.   Eyes: Negative.   Respiratory: Negative.   Cardiovascular: Negative.   Gastrointestinal: Positive for abdominal pain.  Endocrine: Negative.   Genitourinary: Positive for flank pain.  Allergic/Immunologic: Negative.   Neurological: Negative.   Hematological: Negative.   Psychiatric/Behavioral: Negative.   All other systems reviewed and are negative.    Physical Exam Updated Vital Signs BP (!) 152/78 (BP Location: Right Arm)   Pulse 78   Temp 97.7 F (36.5 C) (Oral)   Resp 19   Ht 1.905 m (6\' 3" )   Wt 85.7 kg (189 lb)   SpO2 99%   BMI 23.62 kg/m   Physical Exam   ED Treatments /  Results  Labs (all labs ordered are listed, but only abnormal results are displayed) Labs Reviewed  LIPASE, BLOOD  COMPREHENSIVE METABOLIC PANEL  URINALYSIS, ROUTINE W REFLEX MICROSCOPIC  CBC WITH DIFFERENTIAL/PLATELET  I-STAT CG4 LACTIC ACID, ED    EKG  EKG Interpretation None       Radiology No results found.  Procedures Procedures (including critical care time)  Medications Ordered in ED Medications  sodium chloride 0.9 % bolus 500 mL (0 mLs Intravenous Stopped 12/13/16 1111)  fentaNYL (SUBLIMAZE) injection 50 mcg (50 mcg Intravenous Given 12/13/16 1006)  ketorolac (TORADOL) 30 MG/ML injection 30 mg (30 mg Intravenous Given 12/13/16 1111)     Initial Impression / Assessment and Plan / ED Course  I have reviewed the triage vital signs and the nursing notes.  Pertinent labs & imaging results that were available during my care of the patient were reviewed by me and considered in my medical decision making (see chart for details).    Pain resolved after Toradol.  Discussed CT results with patient awaiting urine Final Clinical Impressions(s) / ED Diagnoses   Final diagnoses:  None    ED Discharge Orders    None       Margarita Grizzleay, Keiaira Donlan, MD 12/13/16 1241

## 2016-12-13 NOTE — ED Notes (Signed)
Pt stable, ambulatory, states understanding of discharge instructions 

## 2016-12-13 NOTE — ED Triage Notes (Signed)
Patient complains of acute left sided abdominal pain since 0600 with multiple episodes of emesis. Patient restless and appears uncomfortable on assessment. Alert and oriented, denies diarrhea

## 2016-12-13 NOTE — ED Notes (Signed)
I Stat Lactic Acid results shown to Dr. Ray. 

## 2016-12-14 LAB — URINE CULTURE

## 2019-02-14 ENCOUNTER — Ambulatory Visit: Payer: PRIVATE HEALTH INSURANCE | Attending: Internal Medicine

## 2019-02-14 DIAGNOSIS — Z20822 Contact with and (suspected) exposure to covid-19: Secondary | ICD-10-CM

## 2019-02-15 LAB — NOVEL CORONAVIRUS, NAA: SARS-CoV-2, NAA: DETECTED — AB

## 2019-02-16 ENCOUNTER — Encounter: Payer: Self-pay | Admitting: Nurse Practitioner

## 2019-02-16 ENCOUNTER — Telehealth (HOSPITAL_COMMUNITY): Payer: Self-pay | Admitting: Nurse Practitioner

## 2019-02-16 DIAGNOSIS — U071 COVID-19: Secondary | ICD-10-CM

## 2019-02-16 NOTE — Telephone Encounter (Signed)
Called to Discuss with patient about Covid symptoms and the use of bamlanivimab, a monoclonal antibody infusion for those with mild to moderate Covid symptoms and at a high risk of hospitalization.     Pt is qualified for this infusion at the [facility] infusion center due to co-morbid conditions and/or a member of an at-risk group.     Unable to reach pt. Left message. Sent mychart message.   Huston Stonehocker, DNP, AGNP-C [phone] (Infusion Center Hotline)  

## 2019-02-17 ENCOUNTER — Telehealth: Payer: Self-pay | Admitting: Nurse Practitioner

## 2019-02-17 NOTE — Telephone Encounter (Signed)
Called to discuss with Sherre Lain about Covid symptoms and the use of bamlanivimab, a monoclonal antibody infusion for those with mild to moderate Covid symptoms and at a high risk of hospitalization.     Pt is qualified for this infusion at the [facility] infusion center due to co-morbid conditions and/or a member of an at-risk group, however declines infusion at this time.   Symptoms tier reviewed as well as criteria for ending isolation.  Symptoms reviewed that would warrant ED/Hospital evaluation. Preventative practices reviewed. Patient verbalized understanding. Patient advised to call back if he decides that he does want to get infusion. Callback number to the infusion center given. Patient advised to go to Urgent care or ED with severe symptoms. Last date he would be eligible is 02/21/19.  Willette Alma, AGPCNP-BC Pager: [phone] Amion: Thea Alken

## 2019-03-06 ENCOUNTER — Ambulatory Visit: Payer: PRIVATE HEALTH INSURANCE

## 2019-09-07 ENCOUNTER — Encounter (HOSPITAL_COMMUNITY): Payer: Self-pay | Admitting: Emergency Medicine

## 2020-05-25 ENCOUNTER — Encounter (HOSPITAL_BASED_OUTPATIENT_CLINIC_OR_DEPARTMENT_OTHER): Payer: Self-pay | Admitting: *Deleted

## 2020-05-25 ENCOUNTER — Other Ambulatory Visit: Payer: Self-pay

## 2020-05-25 ENCOUNTER — Emergency Department (HOSPITAL_BASED_OUTPATIENT_CLINIC_OR_DEPARTMENT_OTHER)
Admission: EM | Admit: 2020-05-25 | Discharge: 2020-05-25 | Disposition: A | Discharge status: Home / Self Care | Payer: PRIVATE HEALTH INSURANCE | Attending: Emergency Medicine | Admitting: Emergency Medicine

## 2020-05-25 DIAGNOSIS — Z87891 Personal history of nicotine dependence: Secondary | ICD-10-CM | POA: Insufficient documentation

## 2020-05-25 DIAGNOSIS — R42 Dizziness and giddiness: Secondary | ICD-10-CM | POA: Insufficient documentation

## 2020-05-25 DIAGNOSIS — R030 Elevated blood-pressure reading, without diagnosis of hypertension: Secondary | ICD-10-CM

## 2020-05-25 LAB — BASIC METABOLIC PANEL
Anion gap: 8 (ref 5–15)
BUN: 16 mg/dL (ref 8–23)
CO2: 26 mmol/L (ref 22–32)
Calcium: 8.9 mg/dL (ref 8.9–10.3)
Chloride: 105 mmol/L (ref 98–111)
Creatinine, Ser: 1.09 mg/dL (ref 0.61–1.24)
GFR, Estimated: >60 mL/min (ref >60)
Glucose, Bld: 90 mg/dL (ref 70–99)
Potassium: 3.7 mmol/L (ref 3.5–5.1)
Sodium: 139 mmol/L (ref 135–145)

## 2020-05-25 LAB — CBC
HCT: 35.8 % — ABNORMAL LOW (ref 39.0–52.0)
Hemoglobin: 11.9 g/dL — ABNORMAL LOW (ref 13.0–17.0)
MCH: 31.6 pg (ref 26.0–34.0)
MCHC: 33.2 g/dL (ref 30.0–36.0)
MCV: 95 fL (ref 80.0–100.0)
Platelets: 257 10*3/uL (ref 150–400)
RBC: 3.77 MIL/uL — ABNORMAL LOW (ref 4.22–5.81)
RDW: 12.6 % (ref 11.5–15.5)
WBC: 4.7 10*3/uL (ref 4.0–10.5)
nRBC: 0 % (ref 0.0–0.2)

## 2020-05-25 LAB — TROPONIN I (HIGH SENSITIVITY): Troponin I (High Sensitivity): 5 ng/L (ref <18)

## 2020-05-25 MED ORDER — MECLIZINE HCL 12.5 MG PO TABS: 12.5000 mg | ORAL_TABLET | Freq: Three times a day (TID) | ORAL | 0 refills | Status: DC | PRN

## 2020-05-25 MED ORDER — MECLIZINE HCL 25 MG PO TABS
12.5000 mg | ORAL_TABLET | Freq: Once | ORAL | Status: AC
  Administered 2020-05-25: 12.5 mg via ORAL
  Filled 2020-05-25: qty 1

## 2020-05-25 NOTE — ED Triage Notes (Signed)
Patient states that since yesterday he dizziness with positioning. States that when he is sitting still denies dizziness but if he was to get up and move or was to go from laying to sitting her would become dizzy. Denies headache, is alert and oriented.   States that he took his blood pressure recently and it was high. Does not take blood pressure medication. Does not have primary care doctor. Denies chest pain.

## 2020-05-25 NOTE — ED Notes (Signed)
Pt discharged to home. Discharge instructions have been discussed with patient and/or family members. Pt verbally acknowledges understanding d/c instructions, and endorses comprehension to checkout at registration before leaving.  °

## 2020-05-25 NOTE — ED Notes (Signed)
Orthostatics not complete. EDP Steinl notified. Requested pt to be ambulated.  Pt ambulated in department. Pt also bent over, raised up multiple time. Pt denies dizziness, able to ambulate with no s/s of distress or complaints.

## 2020-05-25 NOTE — ED Provider Notes (Signed)
[facility] EMERGENCY DEPT Provider Note   CSN: [id number] Arrival date & time: 05/25/20  1211     History Chief Complaint  Patient presents with  . Dizziness    Jesse Larsen is a 70 y.o. male.  Pt c/o 'dizziness', which he describes and mild lightheaded-type sensation, and mild room spinning sensation, worse w movement, changes in position of head, bending over at waist. Symptoms acute onset last evening, at rest. Denies any chest pain or chest discomfort. No associated palpitations, nausea/vomiting, sob or unusual doe, ear pain, tinnitus, hearing loss, nasal congestion. No fever or chills. No recent change in meds or new meds. No recent blood loss, rectal bleeding or melena. Denies hx same symptoms. Mild intermittent frontal headache. No severe, abrupt or acute headaches. No syncope. No problems w normal balance or coordination. No one-sided numbness or weakness. No change in speech or vision.   The history is provided by the patient and a relative.  Dizziness Associated symptoms: no blood in stool, no chest pain, no hearing loss, no nausea, no palpitations, no shortness of breath, no vomiting and no weakness        Past Medical History:  Diagnosis Date  . Helicobacter pylori (H. pylori) infection     There are no problems to display for this patient.   History reviewed. No pertinent surgical history.     History reviewed. No pertinent family history.  Social History   Tobacco Use  . Smoking status: Former Games developer  . Smokeless tobacco: Never Used  Substance Use Topics  . Alcohol use: No  . Drug use: No    Home Medications Prior to Admission medications   Medication Sig Start Date End Date Taking? Authorizing Provider  Ascorbic Acid (VITAMIN C PO) Take 1 tablet by mouth daily.    [provider]  b complex vitamins tablet Take 1 tablet by mouth daily.    [provider]  docusate sodium (COLACE) 100 MG capsule Take 1 capsule  (100 mg total) by mouth every 12 (twelve) hours. 04/03/14   Azalia Bilis, MD  HYDROcodone-acetaminophen (NORCO/VICODIN) 5-325 MG tablet Take 1 tablet by mouth every 4 (four) hours as needed. 04/11/15   Ward, Layla Maw, DO  ibuprofen (ADVIL,MOTRIN) 200 MG tablet Take 200 mg by mouth every 6 (six) hours as needed for mild pain or moderate pain.    [provider]  ondansetron (ZOFRAN ODT) 8 MG disintegrating tablet Take 1 tablet (8 mg total) by mouth every 8 (eight) hours as needed for nausea or vomiting. 12/13/16   Margarita Grizzle, MD  oxyCODONE-acetaminophen (PERCOCET/ROXICET) 5-325 MG tablet Take 2 tablets by mouth every 4 (four) hours as needed for severe pain. 12/13/16   Margarita Grizzle, MD  Zinc 100 MG TABS Take 1 tablet by mouth daily.    [provider]    Allergies    Patient has no known allergies.  Review of Systems   Review of Systems  Constitutional: Negative for chills and fever.  HENT: Negative for ear pain, hearing loss and sore throat.   Eyes: Negative for pain, redness and visual disturbance.  Respiratory: Negative for cough and shortness of breath.   Cardiovascular: Negative for chest pain and palpitations.  Gastrointestinal: Negative for abdominal pain, blood in stool, nausea and vomiting.  Genitourinary: Negative for dysuria and flank pain.  Musculoskeletal: Negative for back pain and neck pain.  Skin: Negative for rash.  Neurological: Positive for dizziness. Negative for speech difficulty, weakness and numbness.  Hematological:  Does not bruise/bleed easily.  Psychiatric/Behavioral: Negative for confusion.    Physical Exam Updated Vital Signs BP (!) 167/84 (BP Location: Left Arm)   Pulse 65   Temp 98 F (36.7 C) (Oral)   Resp 17   SpO2 100%   Physical Exam Vitals and nursing note reviewed.  Constitutional:      Appearance: Normal appearance. He is well-developed.  HENT:     Head: Atraumatic.     Comments: No sinus or temporal tenderness.      Right Ear: Tympanic membrane normal.     Left Ear: Tympanic membrane normal.     Nose: Nose normal.     Mouth/Throat:     Mouth: Mucous membranes are moist.     Pharynx: Oropharynx is clear.  Eyes:     General: No scleral icterus.    Conjunctiva/sclera: Conjunctivae normal.     Pupils: Pupils are equal, round, and reactive to light.  Neck:     Vascular: No carotid bruit.     Trachea: No tracheal deviation.  Cardiovascular:     Rate and Rhythm: Normal rate and regular rhythm.     Pulses: Normal pulses.     Heart sounds: Normal heart sounds. No murmur heard. No friction rub. No gallop.   Pulmonary:     Effort: Pulmonary effort is normal. No accessory muscle usage or respiratory distress.     Breath sounds: Normal breath sounds.  Abdominal:     General: Bowel sounds are normal. There is no distension.     Palpations: Abdomen is soft.     Tenderness: There is no abdominal tenderness. There is no guarding.  Genitourinary:    Comments: No cva tenderness. Musculoskeletal:        General: No swelling or tenderness.     Cervical back: Normal range of motion and neck supple. No rigidity.     Right lower leg: No edema.     Left lower leg: No edema.  Skin:    General: Skin is warm and dry.     Findings: No rash.  Neurological:     Mental Status: He is alert and oriented to person, place, and time.     Cranial Nerves: No cranial nerve deficit.     Comments: Alert, speech clear. No dysarthria or aphasia. Motor intact bil, stre 5/5. No pronator drift. Sens intact bil. Steady gait, no ataxia or unsteadiness.   Psychiatric:        Mood and Affect: Mood normal.     ED Results / Procedures / Treatments   Labs (all labs ordered are listed, but only abnormal results are displayed) Results for orders placed or performed during the hospital encounter of 05/25/20  Basic metabolic panel  Result Value Ref Range   Sodium 139 135 - 145 mmol/L   Potassium 3.7 3.5 - 5.1 mmol/L   Chloride 105 98  - 111 mmol/L   CO2 26 22 - 32 mmol/L   Glucose, Bld 90 70 - 99 mg/dL   BUN 16 8 - 23 mg/dL   Creatinine, Ser 8.93 0.61 - 1.24 mg/dL   Calcium 8.9 8.9 - 81.0 mg/dL   GFR, Estimated >17 >51 mL/min   Anion gap 8 5 - 15  CBC  Result Value Ref Range   WBC 4.7 4.0 - 10.5 K/uL   RBC 3.77 (L) 4.22 - 5.81 MIL/uL   Hemoglobin 11.9 (L) 13.0 - 17.0 g/dL   HCT 02.5 (L) 85.2 - 77.8 %   MCV 95.0  80.0 - 100.0 fL   MCH 31.6 26.0 - 34.0 pg   MCHC 33.2 30.0 - 36.0 g/dL   RDW 27.7 82.4 - 23.5 %   Platelets 257 150 - 400 K/uL   nRBC 0.0 0.0 - 0.2 %    EKG EKG Interpretation  Date/Time:  Saturday May 25 2020 12:52:18 EDT Ventricular Rate:  62 PR Interval:  154 QRS Duration: 120 QT Interval:  452 QTC Calculation: 458 R Axis:   -13 Text Interpretation: Normal sinus rhythm Left ventricular hypertrophy with QRS widening ( R in aVL , Cornell product ) Non-specific ST-t changes No previous tracing Confirmed by Cathren Laine ([id number]) on 05/25/2020 3:51:37 PM   Radiology No results found.  Procedures Procedures   Medications Ordered in ED Medications  meclizine (ANTIVERT) tablet 12.5 mg (has no administration in time range)    ED Course  I have reviewed the triage vital signs and the nursing notes.  Pertinent labs & imaging results that were available during my care of the patient were reviewed by me and considered in my medical decision making (see chart for details).    MDM Rules/Calculators/A&P                         Labs. Ecg done in triage - not 'normal', and no prior to compare - pt denies any current or recent chest pain or discomfort of any sort - will add trop to labs.   Reviewed nursing notes and prior charts for additional history.   Labs reviewed/interpreted by me - chem normal. Trop normal - symptoms (dizziness) present/constant for past day - trop normal, felt not c/w acs. Pt denies any chest pain or discomfort of any sort. No sob or unusual doe.   Po fluids, antivert.    Ambulate in hall. No ataxia or unsteadiness.   Pt currently appears stable for d/c.   Rec pcp f/u.  Return precautions provided.      Final Clinical Impression(s) / ED Diagnoses Final diagnoses:  None    Rx / DC Orders ED Discharge Orders    None       Cathren Laine, MD 05/26/20 1507

## 2020-05-25 NOTE — Discharge Instructions (Addendum)
It was our pleasure to provide your ER care today - we hope that you feel better.  Rest. Drink plenty of fluids/stay well hydrated. Limit salt intake, follow heart health eating plan.   You may try antivert as need for dizziness - no driving when taking, or if/when feeling dizzy.  Follow up with primary care doctor in 1 week if symptoms fail to improve/resolve. Also follow up with primary care doctor/cardiologist in the next 1-2 weeks for your blood pressure, which is high today - call office Monday to arrange appointment.   Return to ER if worse, new symptoms, fevers, new or severe pain, severe headache, chest pain, trouble breathing, severe dizziness, unsteadiness/imbalance, numbness/weakness, change in speech or vision, or other concern.

## 2020-12-10 ENCOUNTER — Emergency Department (HOSPITAL_COMMUNITY)
Admission: EM | Admit: 2020-12-10 | Discharge: 2020-12-10 | Disposition: A | Discharge status: Home / Self Care | Payer: Self-pay | Attending: Emergency Medicine | Admitting: Emergency Medicine

## 2020-12-10 ENCOUNTER — Emergency Department (HOSPITAL_COMMUNITY): Payer: Self-pay

## 2020-12-10 DIAGNOSIS — Z87891 Personal history of nicotine dependence: Secondary | ICD-10-CM | POA: Insufficient documentation

## 2020-12-10 DIAGNOSIS — Z20822 Contact with and (suspected) exposure to covid-19: Secondary | ICD-10-CM | POA: Insufficient documentation

## 2020-12-10 DIAGNOSIS — J069 Acute upper respiratory infection, unspecified: Secondary | ICD-10-CM | POA: Insufficient documentation

## 2020-12-10 LAB — CBC WITH DIFFERENTIAL/PLATELET
Abs Immature Granulocytes: 0.05 10*3/uL (ref 0.00–0.07)
Basophils Absolute: 0 10*3/uL (ref 0.0–0.1)
Basophils Relative: 0 %
Eosinophils Absolute: 0.1 10*3/uL (ref 0.0–0.5)
Eosinophils Relative: 1 %
HCT: 41 % (ref 39.0–52.0)
Hemoglobin: 13.6 g/dL (ref 13.0–17.0)
Immature Granulocytes: 1 %
Lymphocytes Relative: 20 %
Lymphs Abs: 1.9 10*3/uL (ref 0.7–4.0)
MCH: 31.6 pg (ref 26.0–34.0)
MCHC: 33.2 g/dL (ref 30.0–36.0)
MCV: 95.3 fL (ref 80.0–100.0)
Monocytes Absolute: 0.8 10*3/uL (ref 0.1–1.0)
Monocytes Relative: 8 %
Neutro Abs: 6.6 10*3/uL (ref 1.7–7.7)
Neutrophils Relative %: 70 %
Platelets: 319 10*3/uL (ref 150–400)
RBC: 4.3 MIL/uL (ref 4.22–5.81)
RDW: 12.6 % (ref 11.5–15.5)
WBC: 9.4 10*3/uL (ref 4.0–10.5)
nRBC: 0 % (ref 0.0–0.2)

## 2020-12-10 LAB — COMPREHENSIVE METABOLIC PANEL
ALT: 28 U/L (ref 0–44)
AST: 21 U/L (ref 15–41)
Albumin: 3.7 g/dL (ref 3.5–5.0)
Alkaline Phosphatase: 56 U/L (ref 38–126)
Anion gap: 9 (ref 5–15)
BUN: 13 mg/dL (ref 8–23)
CO2: 27 mmol/L (ref 22–32)
Calcium: 9.2 mg/dL (ref 8.9–10.3)
Chloride: 102 mmol/L (ref 98–111)
Creatinine, Ser: 1.42 mg/dL — ABNORMAL HIGH (ref 0.61–1.24)
GFR, Estimated: 53 mL/min — ABNORMAL LOW (ref >60)
Glucose, Bld: 96 mg/dL (ref 70–99)
Potassium: 4.4 mmol/L (ref 3.5–5.1)
Sodium: 138 mmol/L (ref 135–145)
Total Bilirubin: 0.6 mg/dL (ref 0.3–1.2)
Total Protein: 7.7 g/dL (ref 6.5–8.1)

## 2020-12-10 LAB — RESP PANEL BY RT-PCR (FLU A&B, COVID) ARPGX2
Influenza A by PCR: NEGATIVE
Influenza B by PCR: NEGATIVE
SARS Coronavirus 2 by RT PCR: NEGATIVE

## 2020-12-10 MED ORDER — FLUTICASONE PROPIONATE 50 MCG/ACT NA SUSP: 2.0000 | Freq: Every day | NASAL | 0 refills | Status: DC

## 2020-12-10 MED ORDER — BENZONATATE 100 MG PO CAPS: 100.0000 mg | ORAL_CAPSULE | Freq: Three times a day (TID) | ORAL | 0 refills | Status: DC | PRN

## 2020-12-10 NOTE — Discharge Instructions (Signed)

## 2020-12-10 NOTE — ED Provider Notes (Signed)
Emergency Medicine Provider Triage Evaluation Note  Jesse Larsen , a 70 y.o. male  was evaluated in triage.  Pt complains of cold symptoms for 2 days.  Felt feverish, no documented fever.  No history of asthma, COPD, heart failure.  Review of Systems  Positive: Nonproductive cough, nasal congestion, diarrhea Negative: Fever, vomiting  Physical Exam  BP 120/83 (BP Location: Left Arm)   Pulse 100   Temp 98 F (36.7 C) (Oral)   Resp 15   Ht 6\' 1"  (1.854 m)   Wt 83.9 kg   SpO2 100%   BMI 24.41 kg/m  Gen:   Awake, no distress   Resp:  Normal effort  MSK:   Moves extremities without difficulty  Other:  Diffuse mild expiratory wheezing  Medical Decision Making  Medically screening exam initiated at 12:33 PM.  Appropriate orders placed.  Jesse Larsen was informed that the remainder of the evaluation will be completed by another provider, this initial triage assessment does not replace that evaluation, and the importance of remaining in the ED until their evaluation is complete.  Labs and chest x-ray ordered   Terrance Mass 12/10/20 1234    12/12/20, MD 12/14/20 1423

## 2020-12-10 NOTE — ED Provider Notes (Signed)
Emergency Department Provider Note   I have reviewed the triage vital signs and the nursing notes.   HISTORY  Chief Complaint URI   HPI Jesse Larsen is a 70 y.o. male presents to the ED with URI symptoms for the last 2 days. He has cough, congestion, sore throat, and fatigue. Notes some non-bloody diarrhea as well. No radiation of symptoms or modifying factors. No vomiting. No abdominal or rectal pain.    Past Medical History:  Diagnosis Date   Helicobacter pylori (H. pylori) infection     There are no problems to display for this patient.   No past surgical history on file.  Allergies Patient has no known allergies.  No family history on file.  Social History Social History   Tobacco Use   Smoking status: Former   Smokeless tobacco: Never  Substance Use Topics   Alcohol use: No   Drug use: No    Review of Systems  Constitutional: No fever/chills. Positive fatigue.  Eyes: No visual changes. ENT: Mild sore throat with congestion.  Cardiovascular: Denies chest pain. Respiratory: Denies shortness of breath. Positive cough.  Gastrointestinal: No abdominal pain.  No nausea, no vomiting. Positive diarrhea.  No constipation. Genitourinary: Negative for dysuria. Musculoskeletal: Negative for back pain. Skin: Negative for rash. Neurological: Negative for headaches, focal weakness or numbness.  10-point ROS otherwise negative.  ____________________________________________   PHYSICAL EXAM:  VITAL SIGNS: ED Triage Vitals  Enc Vitals Group     BP 12/10/20 1047 120/83     Pulse Rate 12/10/20 1047 100     Resp 12/10/20 1047 15     Temp 12/10/20 1047 98 F (36.7 C)     Temp Source 12/10/20 1047 Oral     SpO2 12/10/20 1047 100 %     Weight 12/10/20 1216 185 lb (83.9 kg)     Height 12/10/20 1216 6\' 1"  (1.854 m)    Constitutional: Alert and oriented. Well appearing and in no acute distress. Eyes: Conjunctivae are normal.  Head: Atraumatic. Nose: No  congestion/rhinnorhea. Mouth/Throat: Mucous membranes are moist.  Oropharynx non-erythematous. Neck: No stridor.   Cardiovascular: Normal rate, regular rhythm. Good peripheral circulation. Grossly normal heart sounds.   Respiratory: Normal respiratory effort.  No retractions. Lungs CTAB. Gastrointestinal: Soft and nontender. No distention.  Musculoskeletal: No lower extremity tenderness nor edema. No gross deformities of extremities. Neurologic:  Normal speech and language. No gross focal neurologic deficits are appreciated.  Skin:  Skin is warm, dry and intact. No rash noted.  ____________________________________________   LABS (all labs ordered are listed, but only abnormal results are displayed)  Labs Reviewed  COMPREHENSIVE METABOLIC PANEL - Abnormal; Notable for the following components:      Result Value   Creatinine, Ser 1.42 (*)    GFR, Estimated 53 (*)    All other components within normal limits  RESP PANEL BY RT-PCR (FLU A&B, COVID) ARPGX2  CBC WITH DIFFERENTIAL/PLATELET   ____________________________________________  EKG  None ____________________________________________  RADIOLOGY  DG Chest 2 View  Result Date: 12/10/2020 CLINICAL DATA:  Shortness of breath, cough, wheezing EXAM: CHEST - 2 VIEW COMPARISON:  None. FINDINGS: The heart size and mediastinal contours are within normal limits. Both lungs are clear. The visualized skeletal structures are unremarkable. Disc degenerative disease of the thoracic spine. IMPRESSION: No acute abnormality of the lungs. Electronically Signed   By: 12/12/2020 M.D.   On: 12/10/2020 13:07    ____________________________________________   PROCEDURES  Procedure(s) performed:  Procedures  None ____________________________________________   INITIAL IMPRESSION / ASSESSMENT AND PLAN / ED COURSE  Pertinent labs & imaging results that were available during my care of the patient were reviewed by me and considered in my  medical decision making (see chart for details).   Patient presents to the ED with cough and congestion but also diarrhea. No abdominal pain or tenderness. No fever. Looking very well here. No CAP on CXR. COVID and FLu are negative. Labs are reassuring. No AKI or dehydration. No severe anemia.    ____________________________________________  FINAL CLINICAL IMPRESSION(S) / ED DIAGNOSES  Final diagnoses:  Viral URI with cough     NEW OUTPATIENT MEDICATIONS STARTED DURING THIS VISIT:  Discharge Medication List as of 12/10/2020  4:56 PM     START taking these medications   Details  benzonatate (TESSALON) 100 MG capsule Take 1 capsule (100 mg total) by mouth 3 (three) times daily as needed for cough., Starting Tue 12/10/2020, Normal    fluticasone (FLONASE) 50 MCG/ACT nasal spray Place 2 sprays into both nostrils daily for 7 days., Starting Tue 12/10/2020, Until Tue 12/17/2020, Normal        Note:  This document was prepared using Dragon voice recognition software and may include unintentional dictation errors.  Alona Bene, MD, [facility] Emergency Medicine    Cniyah Sproull, Arlyss Repress, MD 12/18/20 [phone]

## 2020-12-10 NOTE — ED Triage Notes (Signed)
Pt here d/t cold symptoms X2 days. Pt endorses cough, congestion and diarrhea. Denies fever,vomiting.

## 2020-12-10 NOTE — ED Notes (Signed)
Discharge instructions discussed with pt. Pt verbalized understanding. Pt stable and ambulatory. No signature pad available. 

## 2021-01-08 ENCOUNTER — Emergency Department (HOSPITAL_COMMUNITY)
Admission: EM | Admit: 2021-01-08 | Discharge: 2021-01-08 | Disposition: A | Discharge status: Home / Self Care | Payer: Self-pay | Attending: Emergency Medicine | Admitting: Emergency Medicine

## 2021-01-08 ENCOUNTER — Encounter (HOSPITAL_COMMUNITY): Payer: Self-pay | Admitting: *Deleted

## 2021-01-08 DIAGNOSIS — L739 Follicular disorder, unspecified: Secondary | ICD-10-CM | POA: Insufficient documentation

## 2021-01-08 DIAGNOSIS — M7989 Other specified soft tissue disorders: Secondary | ICD-10-CM | POA: Insufficient documentation

## 2021-01-08 DIAGNOSIS — Z87891 Personal history of nicotine dependence: Secondary | ICD-10-CM | POA: Insufficient documentation

## 2021-01-08 LAB — COMPREHENSIVE METABOLIC PANEL
ALT: 28 U/L (ref 0–44)
AST: 23 U/L (ref 15–41)
Albumin: 3.4 g/dL — ABNORMAL LOW (ref 3.5–5.0)
Alkaline Phosphatase: 53 U/L (ref 38–126)
Anion gap: 7 (ref 5–15)
BUN: 13 mg/dL (ref 8–23)
CO2: 26 mmol/L (ref 22–32)
Calcium: 9.3 mg/dL (ref 8.9–10.3)
Chloride: 104 mmol/L (ref 98–111)
Creatinine, Ser: 1.03 mg/dL (ref 0.61–1.24)
GFR, Estimated: >60 mL/min (ref >60)
Glucose, Bld: 100 mg/dL — ABNORMAL HIGH (ref 70–99)
Potassium: 4.1 mmol/L (ref 3.5–5.1)
Sodium: 137 mmol/L (ref 135–145)
Total Bilirubin: 0.5 mg/dL (ref 0.3–1.2)
Total Protein: 7.7 g/dL (ref 6.5–8.1)

## 2021-01-08 LAB — CBC WITH DIFFERENTIAL/PLATELET
Abs Immature Granulocytes: 0.01 10*3/uL (ref 0.00–0.07)
Basophils Absolute: 0 10*3/uL (ref 0.0–0.1)
Basophils Relative: 0 %
Eosinophils Absolute: 0.1 10*3/uL (ref 0.0–0.5)
Eosinophils Relative: 2 %
HCT: 40.3 % (ref 39.0–52.0)
Hemoglobin: 13.3 g/dL (ref 13.0–17.0)
Immature Granulocytes: 0 %
Lymphocytes Relative: 26 %
Lymphs Abs: 1.8 10*3/uL (ref 0.7–4.0)
MCH: 31.4 pg (ref 26.0–34.0)
MCHC: 33 g/dL (ref 30.0–36.0)
MCV: 95 fL (ref 80.0–100.0)
Monocytes Absolute: 0.9 10*3/uL (ref 0.1–1.0)
Monocytes Relative: 12 %
Neutro Abs: 4.2 10*3/uL (ref 1.7–7.7)
Neutrophils Relative %: 60 %
Platelets: 361 10*3/uL (ref 150–400)
RBC: 4.24 MIL/uL (ref 4.22–5.81)
RDW: 12.6 % (ref 11.5–15.5)
WBC: 7 10*3/uL (ref 4.0–10.5)
nRBC: 0 % (ref 0.0–0.2)

## 2021-01-08 LAB — HIV ANTIBODY (ROUTINE TESTING W REFLEX): HIV Screen 4th Generation wRfx: NONREACTIVE

## 2021-01-08 LAB — BRAIN NATRIURETIC PEPTIDE: B Natriuretic Peptide: 19.8 pg/mL (ref 0.0–100.0)

## 2021-01-08 MED ORDER — DOXYCYCLINE HYCLATE 100 MG PO CAPS: 100.0000 mg | ORAL_CAPSULE | Freq: Two times a day (BID) | ORAL | 0 refills | Status: AC

## 2021-01-08 NOTE — ED Notes (Signed)
Pt verbalized understanding of d/c instructions, meds, and followup care. Denies questions. VSS, no distress noted. Steady gait to exit with all belongings.  ?

## 2021-01-08 NOTE — ED Provider Notes (Signed)
[facility] EMERGENCY DEPARTMENT Provider Note   CSN: [id number] Arrival date & time: 01/08/21  1059     History Chief Complaint  Patient presents with   Abscess    Jesse Larsen is a 70 y.o. male.  Patient presents to the emergency department for evaluation of a pustular rash first noted on the left leg and now spreading to the right leg ongoing for several months.  Patient will develop small "boils" which will break and drain a small amount of pus.  Jesse Larsen has never had this happen to him before.  Distinct areas of infection are painful but no generalized pain.  No claudication type symptoms.  No color change of the skin otherwise.  Jesse Larsen has not a smoker.  Jesse Larsen has had mild swelling of the legs in association with the infection.  No fevers, nausea or vomiting.  Patient does not currently have a primary care doctor and does not take any medications.      Past Medical History:  Diagnosis Date   Helicobacter pylori (H. pylori) infection     There are no problems to display for this patient.   History reviewed. No pertinent surgical history.     No family history on file.  Social History   Tobacco Use   Smoking status: Former   Smokeless tobacco: Never  Substance Use Topics   Alcohol use: No   Drug use: No    Home Medications Prior to Admission medications   Medication Sig Start Date End Date Taking? Authorizing Provider  doxycycline (VIBRAMYCIN) 100 MG capsule Take 1 capsule (100 mg total) by mouth 2 (two) times daily. 01/08/21  Yes Renne Crigler, PA-C  Ascorbic Acid (VITAMIN C PO) Take 1 tablet by mouth daily.    [provider]  b complex vitamins tablet Take 1 tablet by mouth daily.    [provider]  ibuprofen (ADVIL,MOTRIN) 200 MG tablet Take 200 mg by mouth every 6 (six) hours as needed for mild pain or moderate pain.    [provider]  oxyCODONE-acetaminophen (PERCOCET/ROXICET) 5-325 MG tablet Take 2 tablets by  mouth every 4 (four) hours as needed for severe pain. 12/13/16   Margarita Grizzle, MD  Zinc 100 MG TABS Take 1 tablet by mouth daily.    [provider]    Allergies    Patient has no known allergies.  Review of Systems   Review of Systems  Constitutional:  Negative for fever.  Cardiovascular:  Positive for leg swelling.  Musculoskeletal:  Positive for myalgias. Negative for arthralgias.  Skin:  Positive for rash and wound. Negative for color change.   Physical Exam Updated Vital Signs BP (!) 152/85    Pulse 80    Temp 97.6 F (36.4 C) (Oral)    Resp 16    SpO2 100%   Physical Exam Vitals and nursing note reviewed.  Constitutional:      Appearance: Jesse Larsen is well-developed.  HENT:     Head: Normocephalic and atraumatic.  Eyes:     Conjunctiva/sclera: Conjunctivae normal.  Pulmonary:     Effort: No respiratory distress.  Musculoskeletal:     Cervical back: Normal range of motion and neck supple.  Skin:    General: Skin is warm and dry.     Comments: Patient with thickened skin and diffusely scattered small pustules in different stages of the left leg extending up to the thigh and the right leg, more confined to the lower portion of the leg.  No significant cellulitis.  2+ pedal pulses bilaterally.  Distal CMS intact.  Neurological:     Mental Status: Jesse Larsen is alert.    ED Results / Procedures / Treatments   Labs (all labs ordered are listed, but only abnormal results are displayed) Labs Reviewed  COMPREHENSIVE METABOLIC PANEL - Abnormal; Notable for the following components:      Result Value   Glucose, Bld 100 (*)    Albumin 3.4 (*)    All other components within normal limits  CBC WITH DIFFERENTIAL/PLATELET  BRAIN NATRIURETIC PEPTIDE  HIV ANTIBODY (ROUTINE TESTING W REFLEX)  RPR    EKG None  Radiology No results found.  Procedures Procedures   Medications Ordered in ED Medications - No data to display  ED Course  I have reviewed the triage vital signs  and the nursing notes.  Pertinent labs & imaging results that were available during my care of the patient were reviewed by me and considered in my medical decision making (see chart for details).  Patient seen and examined. Plan discussed with patient.   Labs: Ordered in triage, reviewed, no concerning findings, normal white blood cell count, blood sugar 100  Imaging: Not indicated  Medications/Fluids: We will start on prescription of p.o. doxycycline  Vital signs reviewed and are as follows: BP (!) 152/85    Pulse 80    Temp 97.6 F (36.4 C) (Oral)    Resp 16    SpO2 100%   Initial impression: Folliculitis  Discussed with patient and family at bedside, Jesse Larsen would benefit from establishing care with a primary care.  If symptoms do not improve, will need further work-up, possibly a dermatology referral.  Pt urged to return with worsening pain, worsening swelling, expanding area of redness or streaking up extremity, fever, or any other concerns.  Urged to take complete course of antibiotics as prescribed. Pt verbalizes understanding and agrees with plan.    MDM Rules/Calculators/A&P                          No concerns for systemic infection.  Lab work-up is reassuring.  We will start with basic care, oral antibiotics.  Patient would benefit from PCP follow-up outpatient.     Final Clinical Impression(s) / ED Diagnoses Final diagnoses:  Folliculitis    Rx / DC Orders ED Discharge Orders          Ordered    doxycycline (VIBRAMYCIN) 100 MG capsule  2 times daily        01/08/21 1753             Renne Crigler, PA-C 01/08/21 1803    Vanetta Mulders, MD 01/23/21 2328

## 2021-01-08 NOTE — Discharge Instructions (Signed)
Please read and follow all provided instructions.  Your diagnoses today include:  1. Folliculitis     Tests performed today include: Vital signs. See below for your results today.  Complete blood cell count: No concerns, normal infection fighting cell count Basic metabolic panel: No concerns HIV test: Negative Blood test for heart failure/fluid buildup: Was normal  Medications prescribed:  Doxycycline - antibiotic  You have been prescribed an antibiotic medicine: take the entire course of medicine even if you are feeling better. Stopping early can cause the antibiotic not to work.  Take any prescribed medications only as directed.   Home care instructions:  Follow any educational materials contained in this packet. Keep affected area above the level of your heart when possible. Wash area gently twice a day with warm soapy water. Do not apply alcohol or hydrogen peroxide. Cover the area if it draining or weeping.   Follow-up instructions: Please follow-up with your primary care provider in the next 1 week for further evaluation of your symptoms.   Return instructions:  Return to the Emergency Department if you have: Fever Worsening symptoms Worsening pain Worsening swelling Redness of the skin that moves away from the affected area, especially if it streaks away from the affected area  Any other emergent concerns  Your vital signs today were: BP (!) 152/85    Pulse 80    Temp 97.6 F (36.4 C) (Oral)    Resp 16    SpO2 100%  If your blood pressure (BP) was elevated above 135/85 this visit, please have this repeated by your doctor within one month. --------------

## 2021-01-08 NOTE — ED Triage Notes (Signed)
To ED for eval of multiple abscesses on both legs. States noted a couple of months. Has not been on abx in the past.

## 2021-01-08 NOTE — ED Provider Notes (Signed)
Emergency Medicine Provider Triage Evaluation Note  Jesse Larsen , a 70 y.o. male  was evaluated in triage.  Pt complains of pustules and rash extending up both legs for last few months now beginning to develop larger abscesses on the thighs that he describes very painful.  No history of antibiotic treatment.  No history of fevers or chills.  No recent exposures to new chemicals, no history of immunocompromising condition, no notes in the home has similar presentation.  Denies history of IV drug use..  Review of Systems  Positive: Rash on legs with pustules and skin breakdown Negative: Fevers, chills, nausea, vomiting  Physical Exam  BP (!) 150/85    Pulse 86    Temp 97.6 F (36.4 C) (Oral)    Resp 16    SpO2 100%  Gen:   Awake, no distress  Resp:  Normal effort decreased air movement throughout the lung fields no dyspnea with speech MSK:   Moves extremities without difficulty  Other:  Skin maceration and pustules on bilateral anterior lower legs, left greater than right, extending up to the thighs with evidence of healing larger lesions on the anterior thighs.  Mild bilateral lower extremity edema, 1+ nonpitting.  RRR no M/R/G.  Medical Decision Making  Medically screening exam initiated at 12:17 PM.  Appropriate orders placed.  Jesse Larsen was informed that the remainder of the evaluation will be completed by another provider, this initial triage assessment does not replace that evaluation, and the importance of remaining in the ED until their evaluation is complete.  Work-up started.  This chart was dictated using voice recognition software, Dragon. Despite the best efforts of this provider to proofread and correct errors, errors may still occur which can change documentation meaning.    Jesse Lore, PA-C 01/08/21 1220    Jesse Forts, DO 01/08/21 2126

## 2021-01-09 LAB — RPR: RPR Ser Ql: NONREACTIVE

## 2021-03-30 ENCOUNTER — Emergency Department (HOSPITAL_BASED_OUTPATIENT_CLINIC_OR_DEPARTMENT_OTHER)
Admission: EM | Admit: 2021-03-30 | Discharge: 2021-03-30 | Disposition: A | Discharge status: Home / Self Care | Payer: No Typology Code available for payment source | Attending: Emergency Medicine | Admitting: Emergency Medicine

## 2021-03-30 ENCOUNTER — Emergency Department (HOSPITAL_BASED_OUTPATIENT_CLINIC_OR_DEPARTMENT_OTHER): Payer: No Typology Code available for payment source

## 2021-03-30 ENCOUNTER — Other Ambulatory Visit: Payer: Self-pay

## 2021-03-30 DIAGNOSIS — M549 Dorsalgia, unspecified: Secondary | ICD-10-CM

## 2021-03-30 DIAGNOSIS — Y92410 Unspecified street and highway as the place of occurrence of the external cause: Secondary | ICD-10-CM | POA: Insufficient documentation

## 2021-03-30 DIAGNOSIS — M546 Pain in thoracic spine: Secondary | ICD-10-CM | POA: Insufficient documentation

## 2021-03-30 DIAGNOSIS — M542 Cervicalgia: Secondary | ICD-10-CM | POA: Insufficient documentation

## 2021-03-30 MED ORDER — METHOCARBAMOL 500 MG PO TABS: 1000.0000 mg | ORAL_TABLET | Freq: Two times a day (BID) | ORAL | 0 refills | Status: AC

## 2021-03-30 MED ORDER — ACETAMINOPHEN 325 MG PO TABS: 650.0000 mg | ORAL_TABLET | Freq: Four times a day (QID) | ORAL | 0 refills | Status: AC | PRN

## 2021-03-30 MED ORDER — HYDROCODONE-ACETAMINOPHEN 5-325 MG PO TABS
1.0000 | ORAL_TABLET | Freq: Once | ORAL | Status: AC
  Administered 2021-03-30: 1 via ORAL
  Filled 2021-03-30: qty 1

## 2021-03-30 MED ORDER — LIDOCAINE 5 % EX PTCH
1.0000 | MEDICATED_PATCH | CUTANEOUS | Status: DC
  Administered 2021-03-30: 1 via TRANSDERMAL
  Filled 2021-03-30: qty 1

## 2021-03-30 MED ORDER — LIDOCAINE 5 % EX PTCH: 1.0000 | MEDICATED_PATCH | Freq: Every day | CUTANEOUS | 0 refills | Status: AC | PRN

## 2021-03-30 NOTE — Discharge Instructions (Signed)
It was a pleasure caring for you today in the emergency department. ° °Please return to the emergency department for any worsening or worrisome symptoms. ° ° °

## 2021-03-30 NOTE — ED Notes (Signed)
Patient transported to X-ray 

## 2021-03-30 NOTE — ED Provider Notes (Signed)
CT scans without any acute injuries.  Patient can be treated symptomatically ?  ?Vanetta Mulders, MD ?03/30/21 1628 ? ?

## 2021-03-30 NOTE — ED Notes (Signed)
Pt stated pain is much better. Pt was able to get up and walk to the bathroom without any complaints of pain. ?
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
## 2022-01-09 ENCOUNTER — Encounter (HOSPITAL_COMMUNITY): Payer: Self-pay

## 2022-01-09 ENCOUNTER — Other Ambulatory Visit: Payer: Self-pay

## 2022-01-09 ENCOUNTER — Emergency Department (HOSPITAL_COMMUNITY)
Admission: EM | Admit: 2022-01-09 | Discharge: 2022-01-09 | Disposition: A | Discharge status: Home / Self Care | Payer: Self-pay | Attending: Emergency Medicine | Admitting: Emergency Medicine

## 2022-01-09 DIAGNOSIS — R197 Diarrhea, unspecified: Secondary | ICD-10-CM

## 2022-01-09 DIAGNOSIS — R051 Acute cough: Secondary | ICD-10-CM

## 2022-01-09 DIAGNOSIS — Z1152 Encounter for screening for COVID-19: Secondary | ICD-10-CM | POA: Insufficient documentation

## 2022-01-09 DIAGNOSIS — B349 Viral infection, unspecified: Secondary | ICD-10-CM | POA: Insufficient documentation

## 2022-01-09 LAB — CBC WITH DIFFERENTIAL/PLATELET
Abs Immature Granulocytes: 0.03 10*3/uL (ref 0.00–0.07)
Basophils Absolute: 0 10*3/uL (ref 0.0–0.1)
Basophils Relative: 0 %
Eosinophils Absolute: 0.2 10*3/uL (ref 0.0–0.5)
Eosinophils Relative: 2 %
HCT: 39.4 % (ref 39.0–52.0)
Hemoglobin: 13.3 g/dL (ref 13.0–17.0)
Immature Granulocytes: 0 %
Lymphocytes Relative: 21 %
Lymphs Abs: 1.9 10*3/uL (ref 0.7–4.0)
MCH: 32.1 pg (ref 26.0–34.0)
MCHC: 33.8 g/dL (ref 30.0–36.0)
MCV: 95.2 fL (ref 80.0–100.0)
Monocytes Absolute: 1.1 10*3/uL — ABNORMAL HIGH (ref 0.1–1.0)
Monocytes Relative: 13 %
Neutro Abs: 5.8 10*3/uL (ref 1.7–7.7)
Neutrophils Relative %: 64 %
Platelets: 265 10*3/uL (ref 150–400)
RBC: 4.14 MIL/uL — ABNORMAL LOW (ref 4.22–5.81)
RDW: 12.6 % (ref 11.5–15.5)
WBC: 9.1 10*3/uL (ref 4.0–10.5)
nRBC: 0 % (ref 0.0–0.2)

## 2022-01-09 LAB — COMPREHENSIVE METABOLIC PANEL
ALT: 17 U/L (ref 0–44)
AST: 16 U/L (ref 15–41)
Albumin: 3.4 g/dL — ABNORMAL LOW (ref 3.5–5.0)
Alkaline Phosphatase: 48 U/L (ref 38–126)
Anion gap: 7 (ref 5–15)
BUN: 13 mg/dL (ref 8–23)
CO2: 26 mmol/L (ref 22–32)
Calcium: 9.4 mg/dL (ref 8.9–10.3)
Chloride: 106 mmol/L (ref 98–111)
Creatinine, Ser: 1.17 mg/dL (ref 0.61–1.24)
GFR, Estimated: >60 mL/min (ref >60)
Glucose, Bld: 119 mg/dL — ABNORMAL HIGH (ref 70–99)
Potassium: 3.8 mmol/L (ref 3.5–5.1)
Sodium: 139 mmol/L (ref 135–145)
Total Bilirubin: 0.4 mg/dL (ref 0.3–1.2)
Total Protein: 8.1 g/dL (ref 6.5–8.1)

## 2022-01-09 LAB — RESP PANEL BY RT-PCR (RSV, FLU A&B, COVID)  RVPGX2
Influenza A by PCR: NEGATIVE
Influenza B by PCR: NEGATIVE
Resp Syncytial Virus by PCR: NEGATIVE
SARS Coronavirus 2 by RT PCR: NEGATIVE

## 2022-01-09 MED ORDER — ONDANSETRON 4 MG PO TBDP: 4.0000 mg | ORAL_TABLET | Freq: Three times a day (TID) | ORAL | 0 refills | Status: AC | PRN

## 2022-01-09 MED ORDER — LACTATED RINGERS IV BOLUS
1000.0000 mL | Freq: Once | INTRAVENOUS | Status: AC
  Administered 2022-01-09: 1000 mL via INTRAVENOUS

## 2022-01-09 MED ORDER — ONDANSETRON HCL 4 MG/2ML IJ SOLN
4.0000 mg | Freq: Once | INTRAMUSCULAR | Status: AC
  Administered 2022-01-09: 4 mg via INTRAVENOUS
  Filled 2022-01-09: qty 2

## 2022-01-09 NOTE — ED Triage Notes (Signed)
Fever, nausea, and diarrhea after spending christmas in [place].

## 2022-01-09 NOTE — ED Provider Notes (Signed)
[facility]-EMERGENCY DEPT Provider Note   CSN: [id number] Arrival date & time: 01/09/22  0125     History  Chief Complaint  Patient presents with   Fever    Jesse Larsen is a 71 y.o. male.  HPI      71yo male with istory of h pylori presents with fever, nausea and diarrhea.   12/25 came home on [employer] from [place] Began to develop nausea, diarrhea Yesterday went 5-6 times, no black or bloody stools Felt fever initially Monday, now improved Wife brought him vics and felt better No abdominal pain Cough, cough continuing, not worsening but still present for 4 days No congestion No sore throat     Past Medical History:  Diagnosis Date   Helicobacter pylori (H. pylori) infection      Home Medications Prior to Admission medications   Medication Sig Start Date End Date Taking? Authorizing Provider  ondansetron (ZOFRAN-ODT) 4 MG disintegrating tablet Take 1 tablet (4 mg total) by mouth every 8 (eight) hours as needed for nausea or vomiting. 01/09/22  Yes Alvira Monday, MD  acetaminophen (TYLENOL) 325 MG tablet Take 2 tablets (650 mg total) by mouth every 6 (six) hours as needed. 03/30/21   Sloan Leiter, DO  Ascorbic Acid (VITAMIN C PO) Take 1 tablet by mouth daily.    [provider]  b complex vitamins tablet Take 1 tablet by mouth daily.    [provider]  doxycycline (VIBRAMYCIN) 100 MG capsule Take 1 capsule (100 mg total) by mouth 2 (two) times daily. 01/08/21   Renne Crigler, PA-C  ibuprofen (ADVIL,MOTRIN) 200 MG tablet Take 200 mg by mouth every 6 (six) hours as needed for mild pain or moderate pain.    [provider]  lidocaine (LIDODERM) 5 % Place 1 patch onto the skin daily as needed. Remove & Discard patch within 12 hours or as directed by MD 03/30/21   Sloan Leiter, DO  oxyCODONE-acetaminophen (PERCOCET/ROXICET) 5-325 MG tablet Take 2 tablets by mouth every 4 (four) hours as needed for severe pain.  12/13/16   Margarita Grizzle, MD  Zinc 100 MG TABS Take 1 tablet by mouth daily.    [provider]      Allergies    Patient has no known allergies.    Review of Systems   Review of Systems  Physical Exam Updated Vital Signs BP 137/83   Pulse 87   Temp 98.2 F (36.8 C) (Oral)   Resp 16   Ht 6\' 2"  (1.88 m)   Wt 89.8 kg   SpO2 100%   BMI 25.42 kg/m  Physical Exam Vitals and nursing note reviewed.  Constitutional:      General: He is not in acute distress.    Appearance: He is well-developed. He is not diaphoretic.  HENT:     Head: Normocephalic and atraumatic.  Eyes:     Conjunctiva/sclera: Conjunctivae normal.  Cardiovascular:     Rate and Rhythm: Normal rate and regular rhythm.     Heart sounds: Normal heart sounds. No murmur heard.    No friction rub. No gallop.  Pulmonary:     Effort: Pulmonary effort is normal. No respiratory distress.     Breath sounds: Normal breath sounds. No wheezing or rales.  Abdominal:     General: There is no distension.     Palpations: Abdomen is soft.     Tenderness: There is no abdominal tenderness. There is no guarding.  Musculoskeletal:  Cervical back: Normal range of motion.  Skin:    General: Skin is warm and dry.  Neurological:     Mental Status: He is alert and oriented to person, place, and time.     ED Results / Procedures / Treatments   Labs (all labs ordered are listed, but only abnormal results are displayed) Labs Reviewed  CBC WITH DIFFERENTIAL/PLATELET - Abnormal; Notable for the following components:      Result Value   RBC 4.14 (*)    Monocytes Absolute 1.1 (*)    All other components within normal limits  COMPREHENSIVE METABOLIC PANEL - Abnormal; Notable for the following components:   Glucose, Bld 119 (*)    Albumin 3.4 (*)    All other components within normal limits  RESP PANEL BY RT-PCR (RSV, FLU A&B, COVID)  RVPGX2    EKG None  Radiology No results found.  Procedures Procedures     Medications Ordered in ED Medications  lactated ringers bolus 1,000 mL (0 mLs Intravenous Stopped 01/09/22 1338)  ondansetron (ZOFRAN) injection 4 mg (4 mg Intravenous Given 01/09/22 1202)    ED Course/ Medical Decision Making/ A&P                            71yo male with istory of h pylori presents with fever, nausea and diarrhea.  No abdominal pain or tenderness on exam and is most concerned for acute intra-abdominal surgical pathology, SBO.  Low suspicion clinically for pneumonia given no focal findings on exam, no hypoxia, fever has improved, higher suspicion for viral etiology.  Discussed possibility of doing chest x-ray, however the symptoms are suspicious for viral process.  No history of recent antibiotics, lower suspicion for C. difficile.  Labs completed and personally eval and interpreted by me show no anemia, no leukocytosis, electrolyte abnormalities, renal abnormalities, normal transaminitis.  Given IV fluids and Zofran with improvement. Suspect likely viral etiology of diarrhea, nausea, and cough.  Recommend continued supportive care.  Given prescription for Zofran.  Discussed reasons to return detail. Patient discharged in stable condition with understanding of reasons to return.          Final Clinical Impression(s) / ED Diagnoses Final diagnoses:  Viral illness  Diarrhea of presumed infectious origin  Acute cough    Rx / DC Orders ED Discharge Orders          Ordered    ondansetron (ZOFRAN-ODT) 4 MG disintegrating tablet  Every 8 hours PRN        01/09/22 1303              Alvira Monday, MD 01/09/22 2218
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
